# Patient Record
Sex: Female | Born: 1965 | Race: White | Hispanic: No | Marital: Single | Smoking: Former smoker
Health system: Southern US, Community
[De-identification: ages and names within clinical notes are randomized; demographics above are authoritative.]

## PROBLEM LIST (undated history)

## (undated) DIAGNOSIS — R42 Dizziness and giddiness: Secondary | ICD-10-CM

## (undated) DIAGNOSIS — R519 Headache, unspecified: Secondary | ICD-10-CM

## (undated) DIAGNOSIS — F329 Major depressive disorder, single episode, unspecified: Secondary | ICD-10-CM

## (undated) DIAGNOSIS — M8580 Other specified disorders of bone density and structure, unspecified site: Secondary | ICD-10-CM

## (undated) DIAGNOSIS — E0590 Thyrotoxicosis, unspecified without thyrotoxic crisis or storm: Secondary | ICD-10-CM

## (undated) DIAGNOSIS — N189 Chronic kidney disease, unspecified: Secondary | ICD-10-CM

## (undated) DIAGNOSIS — Z1371 Encounter for nonprocreative screening for genetic disease carrier status: Secondary | ICD-10-CM

## (undated) DIAGNOSIS — R51 Headache: Secondary | ICD-10-CM

## (undated) DIAGNOSIS — N200 Calculus of kidney: Secondary | ICD-10-CM

## (undated) DIAGNOSIS — F32A Depression, unspecified: Secondary | ICD-10-CM

## (undated) DIAGNOSIS — K635 Polyp of colon: Secondary | ICD-10-CM

## (undated) HISTORY — DX: Encounter for nonprocreative screening for genetic disease carrier status: Z13.71

## (undated) HISTORY — DX: Thyrotoxicosis, unspecified without thyrotoxic crisis or storm: E05.90

## (undated) HISTORY — DX: Dizziness and giddiness: R42

## (undated) HISTORY — DX: Major depressive disorder, single episode, unspecified: F32.9

## (undated) HISTORY — PX: UPPER GI ENDOSCOPY: SHX6162

## (undated) HISTORY — DX: Polyp of colon: K63.5

## (undated) HISTORY — PX: TUBAL LIGATION: SHX77

## (undated) HISTORY — PX: DILATION AND CURETTAGE OF UTERUS: SHX78

## (undated) HISTORY — DX: Calculus of kidney: N20.0

## (undated) HISTORY — PX: TONSILLECTOMY: SUR1361

## (undated) HISTORY — PX: LUMBAR DISC SURGERY: SHX700

## (undated) HISTORY — DX: Depression, unspecified: F32.A

## (undated) HISTORY — DX: Other specified disorders of bone density and structure, unspecified site: M85.80

---

## 1998-08-04 ENCOUNTER — Other Ambulatory Visit: Admission: RE | Admit: 1998-08-04 | Discharge: 1998-08-04 | Payer: Self-pay | Admitting: Gynecology

## 1999-08-10 ENCOUNTER — Other Ambulatory Visit: Admission: RE | Admit: 1999-08-10 | Discharge: 1999-08-10 | Payer: Self-pay | Admitting: Gynecology

## 2001-09-03 ENCOUNTER — Other Ambulatory Visit: Admission: RE | Admit: 2001-09-03 | Discharge: 2001-09-03 | Payer: Self-pay | Admitting: Gynecology

## 2002-09-08 ENCOUNTER — Other Ambulatory Visit: Admission: RE | Admit: 2002-09-08 | Discharge: 2002-09-08 | Payer: Self-pay | Admitting: Gynecology

## 2003-07-20 ENCOUNTER — Ambulatory Visit (HOSPITAL_BASED_OUTPATIENT_CLINIC_OR_DEPARTMENT_OTHER): Admission: RE | Admit: 2003-07-20 | Discharge: 2003-07-20 | Payer: Self-pay | Admitting: Gynecology

## 2003-11-04 ENCOUNTER — Other Ambulatory Visit: Admission: RE | Admit: 2003-11-04 | Discharge: 2003-11-04 | Payer: Self-pay | Admitting: Gynecology

## 2004-11-15 ENCOUNTER — Other Ambulatory Visit: Admission: RE | Admit: 2004-11-15 | Discharge: 2004-11-15 | Payer: Self-pay | Admitting: Gynecology

## 2005-01-20 ENCOUNTER — Ambulatory Visit (HOSPITAL_COMMUNITY): Admission: RE | Admit: 2005-01-20 | Discharge: 2005-01-20 | Payer: Self-pay | Admitting: Neurosurgery

## 2005-02-14 ENCOUNTER — Encounter (HOSPITAL_COMMUNITY): Admission: RE | Admit: 2005-02-14 | Discharge: 2005-03-16 | Payer: Self-pay | Admitting: Neurosurgery

## 2005-12-12 ENCOUNTER — Other Ambulatory Visit: Admission: RE | Admit: 2005-12-12 | Discharge: 2005-12-12 | Payer: Self-pay | Admitting: Gynecology

## 2006-12-28 ENCOUNTER — Other Ambulatory Visit: Admission: RE | Admit: 2006-12-28 | Discharge: 2006-12-28 | Payer: Self-pay | Admitting: Gynecology

## 2007-04-12 ENCOUNTER — Ambulatory Visit (HOSPITAL_COMMUNITY): Admission: RE | Admit: 2007-04-12 | Discharge: 2007-04-12 | Payer: Self-pay | Admitting: Gynecology

## 2007-04-15 ENCOUNTER — Ambulatory Visit (HOSPITAL_COMMUNITY): Admission: RE | Admit: 2007-04-15 | Discharge: 2007-04-16 | Payer: Self-pay | Admitting: Gynecology

## 2007-04-15 ENCOUNTER — Encounter: Payer: Self-pay | Admitting: Gynecology

## 2007-04-15 HISTORY — PX: ABDOMINAL HYSTERECTOMY: SHX81

## 2008-04-29 ENCOUNTER — Other Ambulatory Visit: Admission: RE | Admit: 2008-04-29 | Discharge: 2008-04-29 | Payer: Self-pay | Admitting: Gynecology

## 2008-05-19 ENCOUNTER — Ambulatory Visit: Payer: Self-pay | Admitting: Gynecology

## 2009-04-30 ENCOUNTER — Other Ambulatory Visit: Admission: RE | Admit: 2009-04-30 | Discharge: 2009-04-30 | Payer: Self-pay | Admitting: Gynecology

## 2009-04-30 ENCOUNTER — Encounter: Payer: Self-pay | Admitting: Gynecology

## 2009-04-30 ENCOUNTER — Ambulatory Visit: Payer: Self-pay | Admitting: Gynecology

## 2009-08-10 ENCOUNTER — Ambulatory Visit: Payer: Self-pay | Admitting: Gynecology

## 2010-05-06 ENCOUNTER — Ambulatory Visit: Payer: Self-pay | Admitting: Gynecology

## 2010-05-06 ENCOUNTER — Other Ambulatory Visit: Admission: RE | Admit: 2010-05-06 | Discharge: 2010-05-06 | Payer: Self-pay | Admitting: Gynecology

## 2010-05-13 ENCOUNTER — Ambulatory Visit: Payer: Self-pay | Admitting: Gynecology

## 2010-05-31 ENCOUNTER — Ambulatory Visit: Payer: Self-pay | Admitting: Gynecology

## 2010-06-04 DIAGNOSIS — Z1371 Encounter for nonprocreative screening for genetic disease carrier status: Secondary | ICD-10-CM

## 2010-06-04 HISTORY — DX: Encounter for nonprocreative screening for genetic disease carrier status: Z13.71

## 2010-07-14 ENCOUNTER — Encounter (HOSPITAL_COMMUNITY)
Admission: RE | Admit: 2010-07-14 | Discharge: 2010-08-13 | Payer: Self-pay | Source: Home / Self Care | Attending: Endocrinology | Admitting: Endocrinology

## 2010-07-23 ENCOUNTER — Emergency Department (HOSPITAL_COMMUNITY): Admission: EM | Admit: 2010-07-23 | Discharge: 2010-07-23 | Payer: Self-pay | Admitting: Emergency Medicine

## 2010-11-15 LAB — GLUCOSE, CAPILLARY: Glucose-Capillary: 74 mg/dL (ref 70–99)

## 2011-01-17 NOTE — Op Note (Signed)
NAME:  Dawn Mcmahon, Dawn Mcmahon                  ACCOUNT NO.:  [id number]   MEDICAL RECORD NO.:  [id number]          PATIENT TYPE:  OIB   LOCATION:  9303                          FACILITY:  [facility]   PHYSICIAN:  Juan H. Lily Peer, M.D.DATE OF BIRTH:  22-Apr-1966   DATE OF PROCEDURE:  04/15/2007  DATE OF DISCHARGE:                               OPERATIVE REPORT   SURGEON:  Juan H. Lily Peer, M.D.   FIRST ASSISTANT:  Edyth Gunnels, M.D.   INDICATIONS FOR OPERATION:  45 year old gravida 2, para 1, AB 1 with  dysmenorrhea, menorrhagia, chronic right lower quadrant pain.   PREOPERATIVE DIAGNOSIS:  1. Chronic right lower quadrant pelvic pain.  2. Dysmenorrhea.  3. Menorrhagia.   POSTOPERATIVE DIAGNOSIS:  1. Chronic right lower quadrant pelvic pain.  2. Dysmenorrhea.  3. Menorrhagia.   ANESTHESIA:  General endotracheal anesthesia.   PROCEDURE PERFORMED:  Laparoscopic-assisted vaginal hysterectomy with  right salpingo-oophorectomy.   FINDINGS:  Questionable endometriotic seeding was noted on the  peritoneal surface near the bladder and the right ovary. Evidence of  previous tubal ligation Hulka clips were noted, proximal fallopian  tubes. Contralateral ovary appeared to be normal.  No abnormalities  noted in the cul-de-sac or the appendix.   DESCRIPTION OF OPERATION:  After the patient adequately counseled she  was taken to the operating room where she underwent successful general  endotracheal anesthesia.  She had PSA stockings for DVT prophylaxis and  received a gram of cefoxitin IV for prophylaxis as well.  She was placed  in low lithotomy position and the abdomen, vagina and perineum were  prepped and draped in usual sterile fashion.  A Foley catheter had been  inserted in effort to monitor urinary output during the operation and  postoperatively.  A small stab incision was made under the umbilicus and  a 10 mm OptiVu trocar was inserted into the peritoneal cavity under  laparoscopic  guidance.  Once the peritoneum was entered the trocar was  removed.  The sleeve was left in place and the laparoscope was inserted.  Two additional port sites were made with 5 mm trocar approximately five  fingerbreadths from the midline in lower abdomen and after systematic  inspection of the pelvic and peritoneal cavity as described above the  procedure was started.  The right infundibulopelvic ligament was  identified and with the use of the harmonic scalpel the  infundibulopelvic ligament was coapted and transected as was the  remainder of the broad and cardinal ligament and round ligament  respectively which was carried down to the level of the right vaginal  fornix. On the contralateral side the left tube and ovary was left in  place.  The left uterosacral ligament was grasped with the harmonic  scalpel and the ligaments were coapted and transected as was the  proximal fallopian tube and round ligament and the remaining broad and  cardinal ligaments to the level of the left vaginal fornix with the  cutting edge of the harmonic scalpel. The bladder flap was established  and the second portion of the operation took effect in the vaginal  region to complete the hysterectomy.  The legs were then placed in high  lithotomy position and short weighted billed speculum was placed in the  posterior vaginal vault.  Both anterior posterior cervical lips were  grasped with a Heaney fashion with Jacobs clamps and put under tension.  The cervicovaginal fold was infiltrated with 1% lidocaine with 1:100,000  epinephrine for total 10 mL. The cervicovaginal fold was incised with a  scalpel in circumferential manner.  The posterior colpotomy was then  established and once entering the cul-de-sac the long weighted speculum  was placed into the posterior cul-de-sac.  The right and left  uterosacral ligaments were clamped, cut and suture ligated with 0 Vicryl  suture as was the remaining of the broad and  cardinal ligaments to level  both pedicles.  The anterior colpotomy was accomplished and the  remaining pedicles were clamped, cut and suture ligated with 0 Vicryl  suture. The uterus, cervix and right tube and ovary were passed off the  operative field.  The posterior vaginal mucosa and peritoneum were  reapproximated in a running baseball stitch from the 3 to 9 o'clock  position.  The area was copiously irrigated with normal saline solution  and closure was started.  The short weighted speculum was then placed  and the vaginal cuff was closed with interrupted sutures of 0 Vicryl  sutures. The area was once again copiously irrigated with normal saline  solution and a third portion of the operation we looked once again to  the peritoneal cavity after new insufflation was started. After  systematic inspection the right infundibulopelvic ligament appeared to  be dry as was the left. Small areas near the cuff that had been oozing  were contained with a bipolar cautery.  The pelvic cavity was copiously  irrigated with normal saline solution. For additional hemostasis  Surgicel was placed at the vaginal cuff. The pneumoperitoneum was  removed.  The instruments were removed under laparoscopic guidance.  The  subumbilical fascia was closed with a figure-of-eight of 0 Vicryl suture  and subcuticular stitch of 3-0 plain catgut suture.  The skin on this  the subumbilical as well as two 5 mm ports were approximated with  Dermabond glue and for postoperative analgesia 0.25% percent Marcaine  was infiltrated all three port sites for total 10 mL. The patient was  then extubated, transferred to recovery with stable vital signs.  Blood  loss was 175 mL, IV fluid 1200 mL of lactated Ringer's.  Urine output  was 100 mL.      Juan H. Lily Peer, M.D.  Electronically Signed     JHF/MEDQ  D:  04/15/2007  T:  04/15/2007  Job:  [id number]

## 2011-01-17 NOTE — Discharge Summary (Signed)
NAME:  Dawn Mcmahon, Dawn Mcmahon                  ACCOUNT NO.:  [id number]   MEDICAL RECORD NO.:  [id number]          PATIENT TYPE:  OIB   LOCATION:  9303                          FACILITY:  [facility]   PHYSICIAN:  Juan H. Lily Peer, M.D.DATE OF BIRTH:  07/09/1966   DATE OF ADMISSION:  04/15/2007  DATE OF DISCHARGE:  04/16/2007                               DISCHARGE SUMMARY   HISTORY OF PRESENT ILLNESS:  The patient is a 45 year old gravida 2,  para 1, AB 1 who was taken to the operating room on the morning of  August 11 where she underwent a laparoscopic-assisted vaginal  hysterectomy with right salpingo-oophorectomy secondary dysmenorrhea,  menorrhagia, chronic lower quadrant pain.  The patient postoperatively  did well and had adequate urine output on her first postoperative day  #1.  Her diet had been advanced from clear to a regular diet, had been  up ambulating and had not passed flatus yet but had good bowel sounds.  Incision sites were intact.  She was to have lunch at noon and be  discharged home later that afternoon. Her postoperative hemoglobin was  9.7, platelet count 146,000.   FINAL DIAGNOSIS:  1. Dysmenorrhea.  2. Menorrhagia.  3. Chronic right lower quadrant pain.  4. Possible endometriosis.  Pathology pending.   PROCEDURE PERFORMED:  Laparoscopic-assisted vaginal hysterectomy, right  salpingo-oophorectomy.   DISPOSITION:  Follow-up; the patient discharged home after first  postoperative day. She was up ambulating, tolerating regular diet well  and is to follow-up in the office in 2 weeks for postop visit.   DISCHARGE MEDICATIONS:  1. She had been given a prescription of Lortab 7.5/500 take one p.o.      q.4-6 hours p.r.n. pain.  2. Reglan 10 mg one p.o. q.4-6 hours p.r.n. nausea.  3. The patient was also to take one iron tablet daily and will check a      CBC at postop visit.      Juan H. Lily Peer, M.D.  Electronically Signed     JHF/MEDQ  D:  04/16/2007  T:   04/16/2007  Job:  [id number]

## 2011-01-17 NOTE — H&P (Signed)
NAME:  Dawn Mcmahon, Dawn Mcmahon                  ACCOUNT NO.:  [id number]   MEDICAL RECORD NO.:  [id number]           PATIENT TYPE:   LOCATION:                                [facility]ACILITY[facility]  [facility]   PHYSICIAN:  Juan H. Lily Peer, M.D.DATE OF BIRTH:  [birth year]   DATE OF ADMISSION:  04/12/2007  DATE OF DISCHARGE:                              HISTORY & PHYSICAL   The patient is scheduled for surgery at [facility] on Monday,  August 11th at 7:30 a.m.   CHIEF COMPLAINT:  Dysmenorrhea, menorrhagia, chronic right lower  quadrant pain.   HISTORY:  The patient is a 45 year old, gravida 2, para 1, AB1, who was  seen in the office on April 09, 2007 for a preoperative consultation.  The patient has had a history of a prior cesarean section and a  laparoscopic tubal ligation in the past.  She had been evaluated on  numerous occasions in the office secondary to dysfunctional uterine  bleeding, chronic right lower quadrant pain, and dyspareunia.  Her  workup consisted of an ultrasound recently which demonstrated normal  uterus and ovaries.  Sonohysterogram with no intracavity defect.  Recent  endometrial biopsy was benign.  Her recent Pap smear was also normal as  well.  She is scheduled for laparoscopic-assisted vaginal hysterectomy  with right salpingo-oophorectomy.  The patient had voiced irregardless  of the findings, she would like to proceed with having her ovary  removed.   PAST MEDICAL HISTORY:   ALLERGIES:  OMNICEF.   MEDICATIONS:  She takes calcium with vitamin D for osteoporosis  prevention.   The only medical problem she has had at times in the past had been some  slight vertigo.   SURGICAL PROCEDURE:  1. One D&C.  2. One C-section.  3. One laparoscopic tubal ligation.  4. Diskectomy at L4-L5.   FAMILY HISTORY:  Father with a history of colon cancer in the 16s.  And,  mother with history of cancer at the age of 62.   PHYSICAL EXAMINATION:  VITAL SIGNS:  The patient weighs 133 pounds.   She  is 5 feet 3 inches tall.  Blood pressure 132/86.  HEENT:  Unremarkable.  NECK:  Supple.  Trachea is midline.  No carotid bruits.  No thyromegaly.  LUNGS:  Clear to auscultation without rhonchi or wheeze.  HEART:  Regular rate and rhythm.  No murmurs or gallops.  BREAST:  Exam not done.  ABDOMEN:  Soft, nontender without rebound or guarding.  PELVIC:  Bartholin, urethral, and Skene glands within normal limits.  Vagina and cervix with no lesions or discharge.  Uterus upper limits of  normal.  No palpable masses or tenderness.  RECTAL:  Deferred.   ASSESSMENT:  A 45 year old gravida 2, para 1, AB1 with a history of  dysfunctional uterine bleeding, chronic right lower quadrant pain and  dyspareunia.   The patient is scheduled for laparoscopic-assisted vaginal hysterectomy  on Monday, April 15, 2007.  The risks, benefits, and pros and cons of  the operation were discussed including infection, bleeding, trauma to  internal organs requiring emergency laparotomy in  the event of  uncontrolled hemorrhage and patient were to need blood or blood  products.  She is fully aware of the potential risks of anaphylactic  reaction, hepatitis, and AIDS.  She will wear PSA stockings for DVT  prophylaxis and will receive antibiotics for prophylaxis as well.  The  patient had previously been provided with literature/information from  the [employer] of OB/GYN outlining laparoscopic hysterectomy and  all questions were entertained and the patient would like to proceed  with the above recommendation.   PLAN:  As per assessment above.      Juan H. Lily Peer, M.D.  Electronically Signed     JHF/MEDQ  D:  04/12/2007  T:  04/12/2007  Job:  [id number]

## 2011-01-20 NOTE — Op Note (Signed)
NAME:  Dawn Mcmahon, Dawn Mcmahon                            ACCOUNT NO.:  [id number]   MEDICAL RECORD NO.:  [id number]                   PATIENT TYPE:  AMB   LOCATION:  [facility]                                 FACILITY:  [facility]   PHYSICIAN:  Juan H. Lily Peer, M.D.             DATE OF BIRTH:  1966-03-26   DATE OF PROCEDURE:  07/20/2003  DATE OF DISCHARGE:                                 OPERATIVE REPORT   INDICATIONS FOR PROCEDURE:  A 45 year old patient requesting elective  permanent sterilization. The patient previously counseled as to the risks,  benefits, pros and cons of laparoscopic tubal sterilization procedure. The  patient fully aware this is a permanent sterilization. She has one 9-year-  old child and wishes not to have any more children.   PREOPERATIVE DIAGNOSIS:  Request for elective sterilization;   POSTOPERATIVE DIAGNOSIS:  Request for elective sterilization;   ANESTHESIA:  General endotracheal anesthesia.   SURGEON:  Juan H. Lily Peer, M.D.   PROCEDURE:  Laparoscopic tubal sterilization Hulka clip technique bilateral.   FINDINGS:  Normal uterus, tubes and ovaries, normal appearing appendix,  smooth appearance of liver surface and normal appearing external  gallbladder.   DESCRIPTION OF PROCEDURE:  After the patient was adequately counseled, she  was taken to the operating room where she underwent a successful general  endotracheal anesthesia. She was placed in the low lithotomy position and  the abdomen, vagina and perineum were prepped and draped in the usual  sterile fashion. A red rubber Roxan Hockey was inserted to evacuate the bladder  of its contents for approximately 75 mL. Examination under anesthesia  demonstrated an anteverted size uterus, no palpable masses or tenderness. A  Hulka tenaculum had been placed to facilitate the movement of the uterus at  the time of laparoscopic procedure. After the drapes were in place, a small  stab incision was made in the infraumbilical  region and a Veress needle was  introduced and opening intraabdominal pressure was 5 mmHg and approximately  2 liters of carbon dioxide were insufflated into the peritoneal cavity. The  Veress needle was removed and then a 10/11 mm trocar was inserted into the  peritoneal cavity, the trocar was removed, the sleeve was left in place and  a 10/11 mm operative scope was introduced into the pelvic cavity. A second  port was made with a 5 mm trocar 2 cm above the symphysis pubis on the  midline under laparoscopic guidance. A systematic inspection of the uterus,  tubes, and ovary demonstrated no evidence of adhesions or endometriosis. A  smooth appearing appendix, smooth appearing liver surface and gallbladder.  Attention was then placed in the proximal one-third portion of the right  fallopian tube. With a self retaining grasper, the distal portion of the  right fallopian tube was placed around the tension and the Hulka clip was  placed and the proximal 1/3 portion of the fallopian tube completely  occluding a small segment of the tube. A similar procedure was carried out  on the contralateral side, pictures were taken before and after the  sterilization procedure, a copy will be kept in the patient's record and a  second copy will be kept in the [facility] office. The carbon  dioxide was removed from the peritoneal cavity, the instruments were  removed. The 10 mm trocar site, the fascia was closed with a figure-of-eight  suture of 3-0 Vicryl suture and subcutaneous tissue was reapproximated with  a subcuticular stitch of 3-0 Vicryl suture. The 5 mm trocar site, the skin  was reapproximated with interrupted sutures of 4-0 plain catgut suture. The  Hulka tenaculum removed, the cervix was inspected and a small area where the  tenaculum was in place was oozing and silver nitrate was used to contain the  bleeding without any further problems.  The patient was the extubated, transferred to  the recovery room with stable  vital signs. She did receive 10 mL of 0.25%  Marcaine was infiltrated in  both incision sites for postoperative analgesia and she received 30 mg of  Toradol in route to the recovery room.                                               Juan H. Lily Peer, M.D.    JHF/MEDQ  D:  07/20/2003  T:  07/20/2003  Job:  [id number]

## 2011-01-20 NOTE — H&P (Signed)
NAME:  Dawn Mcmahon, Dawn Mcmahon                              ACCOUNT NO.:  [id number]   MEDICAL RECORD NO.:  [id number]                   PATIENT TYPE:   LOCATION:                                       FACILITY:   PHYSICIAN:  Juan H. Lily Peer, M.D.             DATE OF BIRTH:   DATE OF ADMISSION:  07/20/2003  DATE OF DISCHARGE:                                HISTORY & PHYSICAL   CHIEF COMPLAINT:  Request for elective permanent sterilization.   HISTORY:  The patient is a 45 year old, gravida 2, para 1, AB 1, who was  seen in January as part of her annual gynecological examination and then  once again for preoperative consultation on July 02, 2003.  The patient  had been requesting elective permanent sterilization.  She is a chronic  smoker.  She had been on Depo-Provera injection in the past and had been  recently using barrier contraception.  When she was seen in the office, she  was given literature information from the [employer] of OB/GYN  outlining laparoscopic tubal sterilization procedure.  She is fully aware  that this procedure is permanent and she will not be able to have more  children.  She has made a conscientious decision and would like to proceed  with this.   ALLERGIES:  She is allergic to OMNICEF.   MEDICATIONS:  She takes Allegra D p.r.n. and Valium 2 mg b.i.d. p.r.n.  She  had been on Depo-Provera in the past.  Her last injection was in May of  2003.  She is also taking calcium supplementation.   PAST MEDICAL HISTORY:  She suffers at times from vertigo.   PAST SURGICAL HISTORY:  Her past operations consist of one D&C and one  cesarean section.   FAMILY HISTORY:  Her mother has a history of colon cancer in her 37s and a  sister with breast cancer at the age of 67.  The patient's last mammogram  was in January of 2003.   PHYSICAL EXAMINATION:  WEIGHT:  The patient weighs 129 pounds.  HEIGHT:  5 feet 2-1/2 inches tall.  VITAL SIGNS:  Blood pressure 124/70.  HEENT:  Unremarkable.  NECK:  Supple.  Trachea midline.  No carotid bruits.  No thyromegaly.  LUNGS:  Clear to auscultation without rhonchi or wheezes.  HEART:  Regular rate and rhythm.  No murmurs or gallops.  BREASTS:  Exam at the time of her annual gynecological examination was  normal.  ABDOMEN:  Soft and nontender without rebound or guarding.  PELVIC:  Bartholin's, urethral, and Skene's glands within normal limits.  Vagina and cervix with no lesions or discharge.  Uterus anteverted and  normal in size, shape, and consistency.  Adnexa without mass or tenderness.  RECTAL:  Exam was deferred.   ASSESSMENT:  The patient is now a 45 year old, gravida 2, para 1, AB 1,  previous cesarean section, prior usage of  Depo-Provera for contraception and  most recently barrier contraception, requesting elective permanent  sterilization.  She has been counseled on numerous occasions and literature  information from the [employer] of OB/GYN had been provided outlining  the need for the laparoscopic tubal sterilization procedure.  Risks  discussed at the time of preoperative consultation consisted of the  following:  The risk from laparoscopic surgery to include trauma to internal  organs or difficulty gaining access to the abdominal cavity requiring a  laparotomy and competing the sterilization and repair of any damage or  trauma from the laparoscopic instruments.  This would require additional  hospitalization dates.  She will receive prophylactic antibiotic.  Due to  the fact that she is 45 years of age and a smoker, we will make sure that  she has pneumatic compression stockings as well to prevent risk for deep  venous thrombosis and subsequent pulmonary embolism.  The patient is fully  aware that the procedure is permanent and that she will not be able to have  any more children.  She feels comfortable with this decision and will follow  according.  In the remote possibility of blood  transfusion, the patient is  fully aware of the risks from blood transfusion from donor blood to include  anaphylactic reaction, hepatitis, and AIDS.  All of the above were discussed  in detail and all questions were answered.  Will follow accordingly.   PLAN:  The patient is scheduled for a laparoscopic tubal sterilization  procedure on Monday, July 20, 2003, at the [facility].                                               Juan H. Lily Peer, M.D.    JHF/MEDQ  D:  07/19/2003  T:  07/19/2003  Job:  [id number]

## 2011-01-20 NOTE — Op Note (Signed)
NAME:  Dawn Mcmahon, Dawn Mcmahon                  ACCOUNT NO.:  [id number]   MEDICAL RECORD NO.:  [id number]          PATIENT TYPE:  OIB   LOCATION:  2899                         FACILITY:  [facility]   PHYSICIAN:  Reinaldo Meeker, M.D. DATE OF BIRTH:  08-15-1966   DATE OF PROCEDURE:  01/20/2005  DATE OF DISCHARGE:                                 OPERATIVE REPORT   PREOPERATIVE DIAGNOSIS:  Herniated disk, L4-5, right.   POSTOPERATIVE DIAGNOSIS:  Herniated disk, L4-5, right.   PROCEDURE:  Right L4-5 intralaminar laminotomy with excision of herniated  disk with operating microscope.   SURGEON:  Reinaldo Meeker, M.D.   ASSISTANT:  Marikay Alar, M.D.   PROCEDURE IN DETAIL:  After being placed in the prone position, the  patient's back was prepped and draped in the usual sterile fashion. An  localizing x-ray was taken prior to incision to identify the appropriate  level. A midline incision made above the spinous process of L4 and L5. Using  the Bovie cutting current, the incision was carried down to spinous  processes. Subperiosteal dissection was then carried out along the right  side of the spinous processes and lamina. Self-retaining retractor was  placed for exposure. X-ray showed we approached the appropriate level. Using  the high-speed drill, we drilled one-third of the L4 lamina and median one-  third of the facet joint was removed. The drill was then used to remove the  superior one-third of the L5 lamina. Residual bone and ligamentum flavum  were removed in a piecemeal fashion. The microscope was draped and brought  in the field and used for the remainder of the case. Using microdissection  technique, the lateral aspect of the thecal sac and L5 nerve root were  identified. Further coagulation was carried out down to the floor of the  canal to identify the L4-5 disk, which was found to be tremendously  herniated beneath the nerve root. After coagulating on the annulus, the  annulus was incised  with a 15 blade. Using pituitary rongeurs and curettes,  thorough disk space cleanout was carried out, while at the same time great  care was taken to avoid injury to the neural elements, and this was  successfully done. At this point, inspection was carried out in all  directions for the evidence of residual compression and none could be  identified. Large amounts of irrigation were carried out and any bleeding  controlled with the help of coagulation and Gelfoam. It was then closed  using interrupted Vicryl in the muscle and fascia, subcutaneous,  subcuticular tissues and Dermabond on the skin. A sterile dressing was then  applied, and the patient was extubated and taken to recovery room in stable  condition.      ROK/MEDQ  D:  01/20/2005  T:  01/20/2005  Job:  [id number]

## 2011-05-04 ENCOUNTER — Encounter: Payer: Self-pay | Admitting: Anesthesiology

## 2011-05-04 DIAGNOSIS — R42 Dizziness and giddiness: Secondary | ICD-10-CM | POA: Insufficient documentation

## 2011-05-18 ENCOUNTER — Other Ambulatory Visit (HOSPITAL_COMMUNITY)
Admission: RE | Admit: 2011-05-18 | Discharge: 2011-05-18 | Disposition: A | Discharge status: Home / Self Care | Payer: BC Managed Care – PPO | Source: Ambulatory Visit | Attending: Gynecology | Admitting: Gynecology

## 2011-05-18 ENCOUNTER — Encounter: Payer: Self-pay | Admitting: Gynecology

## 2011-05-18 ENCOUNTER — Ambulatory Visit (INDEPENDENT_AMBULATORY_CARE_PROVIDER_SITE_OTHER): Payer: BC Managed Care – PPO | Admitting: Gynecology

## 2011-05-18 VITALS — BP 126/82 | Ht 61.75 in | Wt 142.0 lb

## 2011-05-18 DIAGNOSIS — N760 Acute vaginitis: Secondary | ICD-10-CM

## 2011-05-18 DIAGNOSIS — Z01419 Encounter for gynecological examination (general) (routine) without abnormal findings: Secondary | ICD-10-CM

## 2011-05-18 DIAGNOSIS — A499 Bacterial infection, unspecified: Secondary | ICD-10-CM

## 2011-05-18 DIAGNOSIS — E079 Disorder of thyroid, unspecified: Secondary | ICD-10-CM

## 2011-05-18 DIAGNOSIS — R42 Dizziness and giddiness: Secondary | ICD-10-CM

## 2011-05-18 DIAGNOSIS — B9689 Other specified bacterial agents as the cause of diseases classified elsewhere: Secondary | ICD-10-CM

## 2011-05-18 DIAGNOSIS — N951 Menopausal and female climacteric states: Secondary | ICD-10-CM

## 2011-05-18 DIAGNOSIS — Z13220 Encounter for screening for lipoid disorders: Secondary | ICD-10-CM

## 2011-05-18 DIAGNOSIS — Z131 Encounter for screening for diabetes mellitus: Secondary | ICD-10-CM

## 2011-05-18 DIAGNOSIS — Z800 Family history of malignant neoplasm of digestive organs: Secondary | ICD-10-CM

## 2011-05-18 DIAGNOSIS — R51 Headache: Secondary | ICD-10-CM

## 2011-05-18 DIAGNOSIS — B373 Candidiasis of vulva and vagina: Secondary | ICD-10-CM

## 2011-05-18 DIAGNOSIS — Z113 Encounter for screening for infections with a predominantly sexual mode of transmission: Secondary | ICD-10-CM

## 2011-05-18 LAB — T4: T4, Total: 7.1 ug/dL (ref 5.0–12.5)

## 2011-05-18 MED ORDER — FLUCONAZOLE 150 MG PO TABS: 150.0000 mg | ORAL_TABLET | Freq: Once | ORAL | Status: AC

## 2011-05-18 MED ORDER — CLINDAMYCIN PHOSPHATE 2 % VA CREA: 1.0000 | TOPICAL_CREAM | Freq: Every day | VAGINAL | Status: AC

## 2011-05-18 NOTE — Progress Notes (Signed)
Dawn Mcmahon [birth year] [id number]   History:    45 y.o.  for annual exam with several complaints today. Patient stated she's been having dizzy spells over the course of the past 2 months and had seen her primary physician who have treated her for suspected vertigo. Patient also has been followed by Dr. Lurene Shadow because of her history of thyrotoxicosis. She had been off the medication (PTU) but then returned back on it to see if that made any difference with her dizziness and headaches and she said that it made no difference. Her primary physician been referred her to the ENT who evaluated her and found no abnormality. She also had an MRI in 2011 in [place], [place] which was reported to be negative as well. Second issue discussed she wanted to have an STD screen she states that sometimes after intercourse she has alert discharge she is in a steady monogamous relationship. Patient has a history of an LAVH/BSO in the past. She has a mother who had colon cancer at the age of 41 patient as yet not had a colonoscopy. Her sister had breast cancer at the age of 49 as well as an aunt who also had breast cancer. The patient was tested for the BRCA1/BRCA2 mutation and none were detected in October of 2011. She has history of a right breast biopsy which was benign and is overdue for her mammogram this September. The other issue that she had was that time she is complaining of hot flashes and night sweats but very infrequent.  Past medical history,surgical history, family history and social history were all reviewed and documented in the EPIC chart.  ROS:  Was performed and pertinent positives and negatives are included in the history.  Exam: chaperone present BP 126/82  Ht 5' 1.75" (1.568 m)  Wt 142 lb (64.411 kg)  BMI 26.18 kg/m2  Body mass index is 26.18 kg/(m^2).  General appearance : Well developed well nourished female. No acute distress HEENT: Neck supple, trachea midline, no carotid bruits,  no thyroidmegaly Lungs: Clear to auscultation, no rhonchi or wheezes, or rib retractions  Heart: Regular rate and rhythm, no murmurs or gallops Breast:Examined in sitting and supine position were symmetrical in appearance, no palpable masses or tenderness,  no skin retraction, no nipple inversion, no nipple discharge, no skin discoloration, no axillary or supraclavicular lymphadenopathy Abdomen: no palpable masses or tenderness, no rebound or guarding Extremities: no edema or skin discoloration or tenderness  Pelvic:  Bartholin, Urethra, Skene Glands: Within normal limits             Vagina: No gross lesions, thick white discharge was noted. Cervix: Absent  Uterus  absent  Adnexa  Without masses or tenderness  Anus and perineum  normal   Rectovaginal  normal sphincter tone without palpated masses or tenderness             Hemoccult done pending at time of this dictation     Assessment/Plan:  45 y.o. female for annual exam with multiple issues that were discussed today. We'll run a full thyroid panel today because of her history of thyrotoxicosis and no longer been on medications to see this is a contributing factor to her headaches and dizziness. We'll also check a serum prolactin level as well although she denied any nipple discharge per se we'll be checking her CBC to rule out anemia we'll do a cholesterol screen and because of her vasomotor symptoms will check an [facility] in the event that she  may be perimenopausal. The vaginal wet prep demonstrated evidence of bacterial vaginosis and moniliasis for which will be treating her with Diflucan 150 mg by mouth and Cleocin vaginal cream each bedtime for 5 days. Results of the GC and Chlamydia culture pending at time of this dictation. To complete the STD screen and HIV, RPR, and hepatitis B surface antigen test will be drawn today as well. Patient was encouraged to schedule her mammogram and to continue her monthly self breast examination. She was reminded  also to schedule her colonoscopy. Fecal call blood testing pending at time of this dictation. If her thyroid function tests are normal were going to refer her to a neurologist for further evaluation. If her thyroid function or abnormal she will be referred back to Dr. Lurene Shadow who has been monitoring her in the past for thyrotoxicosis. She will call next week for the results and we'll give her guidance as to what the next step will be. She was normotensive today and has had visual exams recently. Pap smear was done today. All questions were answered and we'll follow accordingly.    Ok Edwards MD, 5:37 PM 05/18/2011

## 2011-05-19 LAB — HEPATITIS B SURFACE ANTIGEN: Hepatitis B Surface Ag: NEGATIVE

## 2011-05-19 LAB — RPR

## 2011-05-25 ENCOUNTER — Telehealth: Payer: Self-pay | Admitting: *Deleted

## 2011-05-25 DIAGNOSIS — E7800 Pure hypercholesterolemia, unspecified: Secondary | ICD-10-CM

## 2011-05-25 NOTE — Telephone Encounter (Signed)
Pt informed with the below note and order in computer for repeat lab.

## 2011-05-25 NOTE — Telephone Encounter (Signed)
Message copied by Aura Camps on Thu May 25, 2011  8:48 AM ------      Message from: Ok Edwards      Created: Wed May 24, 2011  5:53 PM       Victorino Dike, please inform patient that I just received her total cholesterol was elevated at 209 it should be less than 200 and I would like for her to have a fasting lipid profile here in the office.

## 2011-06-05 ENCOUNTER — Encounter: Payer: Self-pay | Admitting: Gynecology

## 2011-06-15 ENCOUNTER — Encounter: Payer: Self-pay | Admitting: Gynecology

## 2011-06-15 ENCOUNTER — Ambulatory Visit (INDEPENDENT_AMBULATORY_CARE_PROVIDER_SITE_OTHER): Payer: BC Managed Care – PPO | Admitting: Gynecology

## 2011-06-15 ENCOUNTER — Ambulatory Visit (INDEPENDENT_AMBULATORY_CARE_PROVIDER_SITE_OTHER): Payer: BC Managed Care – PPO | Admitting: *Deleted

## 2011-06-15 VITALS — BP 120/70 | Wt 144.0 lb

## 2011-06-15 DIAGNOSIS — R6882 Decreased libido: Secondary | ICD-10-CM

## 2011-06-15 DIAGNOSIS — N951 Menopausal and female climacteric states: Secondary | ICD-10-CM

## 2011-06-15 DIAGNOSIS — E789 Disorder of lipoprotein metabolism, unspecified: Secondary | ICD-10-CM

## 2011-06-15 DIAGNOSIS — E7800 Pure hypercholesterolemia, unspecified: Secondary | ICD-10-CM

## 2011-06-15 DIAGNOSIS — E8941 Symptomatic postprocedural ovarian failure: Secondary | ICD-10-CM

## 2011-06-15 NOTE — Progress Notes (Signed)
Patient 45-year-old who was seen in the office for her annual exam last week presented to the office for discussion on hormone replacement therapy due to her factor she's been having vasomotor symptoms consisting of night sweats some vaginal dryness some decreased libido irritability and mood swing. Patient with prior history of LAVH/RSO in 2008. Patient previously was cream for the BRCA one BRCA2 mutation which was negative. She had a right breast biopsy in 2011 in January. She is placental followup mammogram sometime in September and as the process of scheduling. She'll schedule colonoscopy for later in the month.  Recent labs her CBC was normal blood sugar was normal total cholesterol elevated 209 she's here in the office today for fasting lipid profile urinalysis was negative she requested an STD screening GC and Chlamydia culture or when necessary I HIV were all negative thyroid function tests were normal but her FSH was elevated slightly at 34.  We will for a detailed discussion of hormone replacement therapy the risks benefits and pros and cons as well as detailed discussion of the WHI. She would like to try the use alternatives we discussed oral versus transdermal application of estrogen. We discussed the Olestra and 0.06% to apply topically to one arm daily and ligature information was provided on this as well. She was given sample and she did buy over-the-counter estroven which has no synthetic estrogen and they were also try soy products along with regular exercise. If her symptoms worsen and she would like to proceed with hormone replacement therapy we'll initiate the very low dose and continue surveillance. She is instructed to follow up with her mammogram to continue monthly self breast examinations. She is also instructed to take her calcium vitamin D for osteoporosis prevention as well. 

## 2011-06-15 NOTE — Progress Notes (Signed)
Patient 45 year old who was seen in the office for her annual exam last week presented to the office for discussion on hormone replacement therapy due to her factor she's been having vasomotor symptoms consisting of night sweats some vaginal dryness some decreased libido irritability and mood swing. Patient with prior history of LAVH/RSO in 2008. Patient previously was cream for the BRCA one BRCA2 mutation which was negative. She had a right breast biopsy in 2011 in January. She is placental followup mammogram sometime in September and as the process of scheduling. She'll schedule colonoscopy for later in the month.  Recent labs her CBC was normal blood sugar was normal total cholesterol elevated 209 she's here in the office today for fasting lipid profile urinalysis was negative she requested an STD screening GC and Chlamydia culture or when necessary I HIV were all negative thyroid function tests were normal but her FSH was elevated slightly at 34.  We will for a detailed discussion of hormone replacement therapy the risks benefits and pros and cons as well as detailed discussion of the WHI. She would like to try the use alternatives we discussed oral versus transdermal application of estrogen. We discussed the Olestra and 0.06% to apply topically to one arm daily and ligature information was provided on this as well. She was given sample and she did buy over-the-counter estroven which has no synthetic estrogen and they were also try soy products along with regular exercise. If her symptoms worsen and she would like to proceed with hormone replacement therapy we'll initiate the very low dose and continue surveillance. She is instructed to follow up with her mammogram to continue monthly self breast examinations. She is also instructed to take her calcium vitamin D for osteoporosis prevention as well.

## 2011-06-19 LAB — CBC
HCT: 27.4 — ABNORMAL LOW
HCT: 37.5
Hemoglobin: 12.8
MCHC: 34.2
MCHC: 35.5
MCV: 89.1
MCV: 89.2
Platelets: 146 — ABNORMAL LOW
Platelets: 205
RDW: 12.3
WBC: 7.1

## 2011-06-19 LAB — URINALYSIS, ROUTINE W REFLEX MICROSCOPIC
Bilirubin Urine: NEGATIVE
Glucose, UA: NEGATIVE
Hgb urine dipstick: NEGATIVE
Protein, ur: NEGATIVE

## 2011-06-22 ENCOUNTER — Telehealth: Payer: Self-pay | Admitting: *Deleted

## 2011-06-22 NOTE — Telephone Encounter (Signed)
Lm for pt to call. Pt called c/o bv infection still there.

## 2011-06-29 ENCOUNTER — Encounter: Payer: Self-pay | Admitting: Gynecology

## 2011-07-04 ENCOUNTER — Encounter: Payer: Self-pay | Admitting: Gynecology

## 2011-07-04 NOTE — Telephone Encounter (Signed)
Pt called wanting recent lab results, results given to pt. 

## 2011-07-06 ENCOUNTER — Encounter: Payer: Self-pay | Admitting: Gynecology

## 2011-08-21 ENCOUNTER — Telehealth: Payer: Self-pay | Admitting: *Deleted

## 2011-08-21 ENCOUNTER — Other Ambulatory Visit: Payer: Self-pay | Admitting: Gynecology

## 2011-08-21 MED ORDER — ESTRADIOL 0.52 MG/0.87 GM (0.06%) TD GEL: TRANSDERMAL | Status: DC

## 2011-08-21 NOTE — Telephone Encounter (Signed)
rx sent to pharmacy

## 2011-08-21 NOTE — Telephone Encounter (Signed)
Patient will be prescribed as we discussed before in the office visit elestrin 0.06% transdermal to apply one pump to one arm only daily. We had discussed the risks benefits and pros and cons of hormone replacement therapy at her last visit and her mammogram is up-to-date.

## 2011-08-21 NOTE — Telephone Encounter (Signed)
Pt called stating that she was told to call to get RX for estrogen tropical gel if decided to use this for vasomotor symptoms? Pt was seen on 06/16/11. Please advise

## 2011-08-23 ENCOUNTER — Ambulatory Visit (INDEPENDENT_AMBULATORY_CARE_PROVIDER_SITE_OTHER): Payer: BC Managed Care – PPO | Admitting: Gynecology

## 2011-08-23 ENCOUNTER — Encounter: Payer: Self-pay | Admitting: Gynecology

## 2011-08-23 VITALS — BP 142/86

## 2011-08-23 DIAGNOSIS — N898 Other specified noninflammatory disorders of vagina: Secondary | ICD-10-CM

## 2011-08-23 DIAGNOSIS — B373 Candidiasis of vulva and vagina: Secondary | ICD-10-CM

## 2011-08-23 MED ORDER — FLUCONAZOLE 100 MG PO TABS: 100.0000 mg | ORAL_TABLET | ORAL | Status: AC

## 2011-08-23 MED ORDER — FLUCONAZOLE 100 MG PO TABS: 100.0000 mg | ORAL_TABLET | Freq: Once | ORAL | Status: DC

## 2011-08-23 NOTE — Patient Instructions (Signed)
Take one fluconazole mtablet weekly for three months. Remember to follow up with Dr. Vela Prose

## 2011-08-23 NOTE — Progress Notes (Signed)
Patient presented to the office today with a complaint of vaginal discharge. Patient and monogamous relationship. Patient seen few months ago for annual exam who was complaining of vasomotor symptoms an [facility] was obtained and demonstrated she was in the menopausal state. Patient with prior history of total, hysterectomy with right salpingo-oophorectomy. Patient has had issues of recurrent BV and yeast in the past. She also brought to my attention that she's been followed by Dr. Vela Prose for her migraine headaches and recently was complaining of on and off dizziness. She also has informed me that recently she had a colonoscopy in a benign polyp was identified.  Blood pressure repeat 140/80  Patient brought to my attention that last week and she had acute sharp left lower quadrant discomfort the side where she has her remaining ovary and this has occurred sporadically.  Exam: Pelvic: Bartholin urethra Skene glands within normal limits vagina: Atrophic changes vaginal cuff intact bimanual exam: No palpable masses or tenderness, rectal exam not done.  Wet prep demonstrated moniliasis because of her recurrence he she will be placed on Diflucan 100 mg one by mouth q. weekly for 3 months. She'll continue to use a probiotic tablet daily. She should refrain from any douching and cut down on sweets and dairy products.  It was recommended she followup with her neurologist because of her dizzy symptoms since he has been monitoring her and treating her for migraines.

## 2011-10-12 ENCOUNTER — Encounter: Payer: Self-pay | Admitting: *Deleted

## 2011-10-12 ENCOUNTER — Telehealth: Payer: Self-pay | Admitting: *Deleted

## 2011-10-12 NOTE — Progress Notes (Signed)
Patient ID: Dawn Mcmahon, female   DOB: [birth year], 46 y.o.   MRN: [id number] Pt called and left message stating JF was going to have referral for gen. Surg. Doctor? Left message for pt to call.

## 2011-10-12 NOTE — Telephone Encounter (Signed)
We can give her the telephone number to Dr. Ovidio Kin or Dr. Abbey Chatters or Dr. Daphine Deutscher for her to followup if she still continues to have symptoms with her hemorrhoids.

## 2011-10-12 NOTE — Telephone Encounter (Signed)
Pt called and said that at her last office visit on 08/23/11 she spoke with you about a referral to general surgeon doctor for hemorrhoids? I didn't see anything in office note about this. Please advise

## 2011-10-18 NOTE — Telephone Encounter (Signed)
Tired to call pt and tell the below, but her phone would not let me record a message. Will try again.

## 2011-10-23 NOTE — Telephone Encounter (Signed)
Spoke with pt regarding the below note. 

## 2011-12-25 ENCOUNTER — Telehealth: Payer: Self-pay | Admitting: *Deleted

## 2011-12-25 NOTE — Telephone Encounter (Signed)
Pt called stating her daughter father went to the [place] hospital and was recently told that her was positive for hepatis C. Pt would like to know if she could get tested for this?

## 2011-12-26 NOTE — Telephone Encounter (Signed)
Patient will need to come in to the office to talk/ exam and draw the appropriate Hepatitis Panel

## 2011-12-26 NOTE — Telephone Encounter (Signed)
Left the below on pt voicemail. 

## 2011-12-27 ENCOUNTER — Encounter: Payer: Self-pay | Admitting: Gynecology

## 2011-12-27 ENCOUNTER — Ambulatory Visit (INDEPENDENT_AMBULATORY_CARE_PROVIDER_SITE_OTHER): Payer: BC Managed Care – PPO | Admitting: Gynecology

## 2011-12-27 VITALS — BP 130/88

## 2011-12-27 DIAGNOSIS — Z205 Contact with and (suspected) exposure to viral hepatitis: Secondary | ICD-10-CM

## 2011-12-27 DIAGNOSIS — Z20828 Contact with and (suspected) exposure to other viral communicable diseases: Secondary | ICD-10-CM

## 2011-12-27 DIAGNOSIS — Z113 Encounter for screening for infections with a predominantly sexual mode of transmission: Secondary | ICD-10-CM

## 2011-12-27 DIAGNOSIS — N898 Other specified noninflammatory disorders of vagina: Secondary | ICD-10-CM

## 2011-12-27 NOTE — Progress Notes (Signed)
Patient is a 46 year old who presented to the office today with concerns that her previous partner of many years had been diagnosed with hepatitis C. Patient denies any symptoms. Patient does have tattoos but denies any use of illicit drugs. She does have a recent new partner over the past few months and had a slight vaginal discharge and wanted to have also a full STD screen today.  Exam: Bartholin urethra Skene glands: Within normal limits Vagina: No lesions or discharge Cervix: No lesions or discharge Uterus: Anteverted normal size shape and consistency Adnexa: No palpable masses or tenderness rectal exam: Not done  GC and Chlamydia culture along with wet prep was done. Wet prep was negative cultures pending.  Patient will pass by the lab and we'll obtain a full acute hepatitis panel along with HIV and RPR. We'll have the results of the other week and notify the patient. I have recommended that her daughter who has lived with this individual (her father) that she also be tested for hepatitis.

## 2011-12-27 NOTE — Patient Instructions (Signed)
Viral Hepatitis Hepatitis is an inflammation of the liver. It can be caused by many different things, including several different viruses. These viruses are named hepatitis A, B, C, D, and E. All these viruses can cause short-term (acute) hepatitis. The hepatitis B, C, and D viruses can also cause longstanding (chronic) hepatitis. Chronic hepatitis infection is prolonged and sometimes lifelong. Other viruses may also cause hepatitis but have not yet been discovered. SYMPTOMS Some people have no symptoms. Others may have:  Fatigue.   Abdominal pain.   Loss of appetite.   Nausea.   Vomiting.   Low-grade fever.   Yellowing of the skin (jaundice).  HEPATITIS A Disease Spread Primarily through food or water contaminated by feces from an infected person. People at Risk  Travelers to any area of the world with poor sanitation.   People living in areas where hepatitis A outbreaks are common.   People who live with or have close contact with an infected person.   During outbreaks:   Day-care children and employees.   Men who have sex with men.   Injection drug users.   People who eat raw or undercooked shellfish (oysters, clams, mussels).   People who live or work in institutions.  Prevention  Receive the hepatitis A vaccine.   Avoid tap water, ice cubes made from tap water, and uncooked or inadequately cooked foods when traveling to areas with poor sanitation.   Avoid eating raw or undercooked shellfish.   Practice good hygiene and sanitation.  Treatment Hepatitis A usually resolves on its own over several weeks. HEPATITIS B Disease Spread  Through contact with infected blood.   Through sex with an infected person.   From mother to child during childbirth.  People at Risk  People who have sex with an infected person.   Men who have sex with men.   Injection drug users.   Children of immigrants from disease-endemic areas.   Infants born to infected mothers.    People who live with an infected person and are exposed to that person's blood.   Healthcare workers exposed to blood.   Hemodialysis patients.   People who received a transfusion of blood or blood products before July 1992 or clotting factors made before 1987.  Prevention  Receive the hepatitis B vaccine.   Healthcare workers need to avoid injuries and wear appropriate protective equipment such as gloves, gowns, and face masks when performing invasive medical or nursing procedures.   After exposure to infectious blood, if you were not previously and successfully vaccinated, receive a gamma globulin shot (HBIG), hepatitis B vaccine, or both.  Treatment Acute hepatitis B usually resolves on its own. For chronic hepatitis B, drug treatment is available and advised for many but not all patients. All patients who have chronic hepatitis B infection should be carefully monitored by a caregiver over time.  HEPATITIS C Disease Spread  Through contact with infected blood.   Through sex with an infected person.   From mother to child during childbirth.  People at Risk  Injection drug users.   People who have sex with an infected person.   People who have multiple sex partners.   Healthcare workers exposed to blood.   Infants born to infected mothers.   Hemodialysis patients.   People who received a transfusion of blood or blood products before July 1992 or clotting factors made before 1987.   People born between [birth year] and 1965.  Prevention  There is no vaccine for hepatitis C.  The only way to prevent the disease is to reduce the risk of exposure to blood that is contaminated with the virus. This means avoiding behaviors like sharing drug needles or sharing personal items like toothbrushes, razors, and nail clippers with an infected person.   Healthcare workers need to avoid injuries and wear appropriate protective equipment such as gloves, gowns, and face masks when performing  invasive medical or nursing procedures.  Treatment For acute hepatitis C, treatment may be recommended if it does not resolve within 2 to 3 months. For chronic hepatitis C, drug treatment is available and advised for many but not all patients. All patients who have chronic hepatitis C infection should be carefully monitored by a caregiver over time. HEPATITIS D Disease Spread Through contact with infected blood. This disease happens only in people who are already infected with hepatitis B or who become infected with hepatitis B and hepatitis D at the same time. People at Risk Anyone infected with hepatitis B. Injection drug users who have hepatitis B have the highest risk. People who have hepatitis B are also at risk if they have sex with a person infected with hepatitis D or if they live with an infected person. Also at risk are people who received a transfusion of blood or blood products before July 1992 or clotting factors made before 1987. Prevention  Receive the hepatitis B vaccine if you are not already infected.   Avoid exposure to infected blood.   Avoid exposure to contaminated needles.   Avoid exposure to an infected person's personal items (toothbrush, razor, nail clippers).  Treatment The combination of hepatitis D and hepatitis B infection usually causes very serious and progressive liver disease. Because of this, drug treatment is almost always recommended. Treatment regimens are the same as those recommended for the hepatitis B infection. HEPATITIS E Disease Spread Through food or water contaminated by feces from an infected person. This disease is common in [place], [place], the [place], and [place].  People at Risk  Travelers to areas of the world where hepatitis E infection is common.   People living in areas where hepatitis E outbreaks are common.   People who live with an infected person.  Prevention A vaccine for hepatitis E is not yet available. Currently,  the only way to prevent the disease is to reduce the risk of exposure to the virus. This means avoiding tap water, ice cubes made from tap water, and uncooked or inadequately cooked foods when traveling to hepatitis E endemic areas with poor sanitation.  Treatment Hepatitis E usually resolves on its own over several weeks to months. OTHER CAUSES OF VIRAL HEPATITIS Some cases of viral hepatitis cannot be attributed to the hepatitis A, B, C, D, or E viruses. This is called non A-E hepatitis. Scientists continue to study the causes of non A-E hepatitis. Document Released: 10/13/2004 Document Revised: 08/10/2011 Document Reviewed: 12/22/2010 [facility] Patient Information 2012 [place], [place].

## 2011-12-28 LAB — HEPATITIS PANEL, ACUTE
HCV Ab: NEGATIVE
Hep A IgM: NEGATIVE
Hep B C IgM: NEGATIVE
Hepatitis B Surface Ag: NEGATIVE

## 2011-12-28 LAB — GC/CHLAMYDIA PROBE AMP, GENITAL: GC Probe Amp, Genital: NEGATIVE

## 2012-03-17 ENCOUNTER — Encounter (HOSPITAL_COMMUNITY): Payer: Self-pay

## 2012-03-17 ENCOUNTER — Observation Stay (HOSPITAL_COMMUNITY)
Admission: EM | Admit: 2012-03-17 | Discharge: 2012-03-19 | Disposition: A | Discharge status: Home / Self Care | Payer: BC Managed Care – PPO | Attending: Urology | Admitting: Urology

## 2012-03-17 ENCOUNTER — Emergency Department (HOSPITAL_COMMUNITY): Payer: BC Managed Care – PPO

## 2012-03-17 DIAGNOSIS — R112 Nausea with vomiting, unspecified: Secondary | ICD-10-CM

## 2012-03-17 DIAGNOSIS — R42 Dizziness and giddiness: Secondary | ICD-10-CM | POA: Insufficient documentation

## 2012-03-17 DIAGNOSIS — N201 Calculus of ureter: Principal | ICD-10-CM | POA: Insufficient documentation

## 2012-03-17 DIAGNOSIS — N390 Urinary tract infection, site not specified: Secondary | ICD-10-CM | POA: Insufficient documentation

## 2012-03-17 DIAGNOSIS — N132 Hydronephrosis with renal and ureteral calculous obstruction: Secondary | ICD-10-CM

## 2012-03-17 DIAGNOSIS — N1330 Unspecified hydronephrosis: Secondary | ICD-10-CM | POA: Insufficient documentation

## 2012-03-17 DIAGNOSIS — R509 Fever, unspecified: Secondary | ICD-10-CM

## 2012-03-17 LAB — CBC WITH DIFFERENTIAL/PLATELET
Basophils Absolute: 0 10*3/uL (ref 0.0–0.1)
Basophils Relative: 0 % (ref 0–1)
Eosinophils Absolute: 0 10*3/uL (ref 0.0–0.7)
Eosinophils Relative: 0 % (ref 0–5)
HCT: 36.8 % (ref 36.0–46.0)
Hemoglobin: 12.7 g/dL (ref 12.0–15.0)
Lymphocytes Relative: 3 % — ABNORMAL LOW (ref 12–46)
Lymphs Abs: 0.3 10*3/uL — ABNORMAL LOW (ref 0.7–4.0)
MCH: 31.1 pg (ref 26.0–34.0)
MCHC: 34.5 g/dL (ref 30.0–36.0)
MCV: 90.2 fL (ref 78.0–100.0)
Monocytes Absolute: 0.2 10*3/uL (ref 0.1–1.0)
Monocytes Relative: 2 % — ABNORMAL LOW (ref 3–12)
Neutro Abs: 10.7 10*3/uL — ABNORMAL HIGH (ref 1.7–7.7)
Neutrophils Relative %: 95 % — ABNORMAL HIGH (ref 43–77)
Platelets: 165 10*3/uL (ref 150–400)
RBC: 4.08 MIL/uL (ref 3.87–5.11)
RDW: 11.9 % (ref 11.5–15.5)
WBC: 11.2 10*3/uL — ABNORMAL HIGH (ref 4.0–10.5)

## 2012-03-17 LAB — COMPREHENSIVE METABOLIC PANEL
ALT: 21 U/L (ref 0–35)
AST: 38 U/L — ABNORMAL HIGH (ref 0–37)
Albumin: 3.6 g/dL (ref 3.5–5.2)
Alkaline Phosphatase: 55 U/L (ref 39–117)
BUN: 15 mg/dL (ref 6–23)
CO2: 24 mEq/L (ref 19–32)
Calcium: 9.4 mg/dL (ref 8.4–10.5)
Chloride: 101 mEq/L (ref 96–112)
Creatinine, Ser: 1.04 mg/dL (ref 0.50–1.10)
GFR calc Af Amer: 73 mL/min — ABNORMAL LOW (ref >90)
GFR calc non Af Amer: 63 mL/min — ABNORMAL LOW (ref >90)
Glucose, Bld: 120 mg/dL — ABNORMAL HIGH (ref 70–99)
Potassium: 3.6 mEq/L (ref 3.5–5.1)
Sodium: 138 mEq/L (ref 135–145)
Total Bilirubin: 0.5 mg/dL (ref 0.3–1.2)
Total Protein: 7.1 g/dL (ref 6.0–8.3)

## 2012-03-17 LAB — URINALYSIS, ROUTINE W REFLEX MICROSCOPIC
Bilirubin Urine: NEGATIVE
Glucose, UA: NEGATIVE mg/dL
Ketones, ur: 15 mg/dL — AB
Nitrite: NEGATIVE
Protein, ur: NEGATIVE mg/dL
Specific Gravity, Urine: 1.015 (ref 1.005–1.030)
Urobilinogen, UA: 0.2 mg/dL (ref 0.0–1.0)
pH: 5.5 (ref 5.0–8.0)

## 2012-03-17 LAB — URINE MICROSCOPIC-ADD ON

## 2012-03-17 MED ORDER — GENTAMICIN SULFATE 40 MG/ML IJ SOLN
450.0000 mg | INTRAVENOUS | Status: DC
  Administered 2012-03-17 – 2012-03-19 (×2): 450 mg via INTRAVENOUS
  Filled 2012-03-17 (×3): qty 11.25

## 2012-03-17 MED ORDER — SODIUM CHLORIDE 0.9 % IV BOLUS (SEPSIS)
1000.0000 mL | Freq: Once | INTRAVENOUS | Status: AC
  Administered 2012-03-17: 1000 mL via INTRAVENOUS

## 2012-03-17 MED ORDER — DEXTROSE 5 % IV SOLN
1.0000 g | Freq: Once | INTRAVENOUS | Status: AC
  Administered 2012-03-17: 1 g via INTRAVENOUS
  Filled 2012-03-17: qty 10

## 2012-03-17 MED ORDER — SODIUM CHLORIDE 0.9 % IV SOLN
INTRAVENOUS | Status: DC
  Administered 2012-03-17 – 2012-03-19 (×4): via INTRAVENOUS

## 2012-03-17 MED ORDER — ONDANSETRON HCL 4 MG/2ML IJ SOLN
4.0000 mg | INTRAMUSCULAR | Status: DC | PRN
  Administered 2012-03-18 – 2012-03-19 (×5): 4 mg via INTRAVENOUS
  Filled 2012-03-17 (×5): qty 2

## 2012-03-17 MED ORDER — ONDANSETRON HCL 4 MG/2ML IJ SOLN
4.0000 mg | Freq: Once | INTRAMUSCULAR | Status: AC
  Administered 2012-03-17: 4 mg via INTRAVENOUS
  Filled 2012-03-17: qty 2

## 2012-03-17 MED ORDER — ACETAMINOPHEN 325 MG PO TABS
650.0000 mg | ORAL_TABLET | Freq: Once | ORAL | Status: AC
  Administered 2012-03-17: 650 mg via ORAL
  Filled 2012-03-17: qty 2

## 2012-03-17 MED ORDER — HYDROMORPHONE HCL PF 1 MG/ML IJ SOLN
1.0000 mg | Freq: Once | INTRAMUSCULAR | Status: AC
  Administered 2012-03-17: 1 mg via INTRAVENOUS
  Filled 2012-03-17: qty 1

## 2012-03-17 MED ORDER — MORPHINE SULFATE 2 MG/ML IJ SOLN
2.0000 mg | INTRAMUSCULAR | Status: DC | PRN
  Administered 2012-03-18 (×2): 4 mg via INTRAVENOUS
  Administered 2012-03-18 – 2012-03-19 (×6): 2 mg via INTRAVENOUS
  Filled 2012-03-17: qty 1
  Filled 2012-03-17: qty 2
  Filled 2012-03-17 (×5): qty 1
  Filled 2012-03-17: qty 2
  Filled 2012-03-17: qty 1

## 2012-03-17 NOTE — ED Notes (Signed)
I gave the patient a cup of ice and a ginger-ale. 

## 2012-03-17 NOTE — ED Notes (Signed)
Pt informed of diet, nothing by mouth with verbal understanding.

## 2012-03-17 NOTE — ED Notes (Signed)
Attempted to call report, RN Stanton Kidney unavailable at this time and to call back for pt report prior to transfer.

## 2012-03-17 NOTE — ED Notes (Signed)
MD Mcdiarmid changed pt diet to NPO after midnight.

## 2012-03-17 NOTE — ED Notes (Signed)
Pt reports decrease pain to 6/10, crackers given to pt. Pt has tolerated ginger ale with no reported or observed n/v. Pt is awaiting transfer to [facility] facility via [facility].

## 2012-03-17 NOTE — Progress Notes (Signed)
ANTIBIOTIC CONSULT NOTE - INITIAL  Pharmacy Consult for Gentamicin Indication:  Empiric for UTI  Allergies  Allergen Reactions  . Omnicef (Cefdinir)     disorientated    Patient Measurements: Height: 5' 2.5" (158.8 cm) Weight: 143 lb (64.864 kg) IBW/kg (Calculated) : 51.25   Vital Signs: Temp: 98.1 F (36.7 C) (07/14 2147) Temp src: Oral (07/14 2147) BP: 111/61 mmHg (07/14 2147) Pulse Rate: 102  (07/14 2147)  Labs:  Basename 03/17/12 1739  WBC 11.2*  HGB 12.7  PLT 165  LABCREA --  CREATININE 1.04   Estimated Creatinine Clearance: 60.5 ml/min (by C-G formula based on Cr of 1.04).  Microbiology: No results found for this or any previous visit (from the past 720 hour(s)).  Medical History: Past Medical History  Diagnosis Date  . BRCA1 negative 06/2010  . BRCA2 negative 06/2010  . Vertigo   . Depression   . Thyrotoxicosis   . Benign colon polyp    Medications:  Anti-infectives     Start     Dose/Rate Route Frequency Ordered Stop   03/17/12 2015   cefTRIAXone (ROCEPHIN) 1 g in dextrose 5 % 50 mL IVPB        1 g 100 mL/hr over 30 Minutes Intravenous  Once 03/17/12 2007 03/17/12 2201         Assessment: 46yo female admitted with severe flank pain with fever to 103.3.  She has some mild leukocytosis and CT results reveals kidney stone.  She is being started on IV antibiotics empirically for presumed UTI.  Her creatinine is 1.0 with an estimated clearance ~ 60 ml/min.  She has an allergy to [facility] but not to aminoglycosides.  Given renal function, will utilize once daily dosing and monitor levels to ensure appropriate response.  Goal of Therapy:  Natasha Bence. Trough < 1 mcg/ml  Plan:   Gentamicin 450mg  IV every 24 hours.  Will monitor levels as needed to determine appropriate response.  F/U renal function and adjust as needed.  Nadara Mustard, PharmD., MS Clinical Pharmacist Pager:  [phone]  Thank you for allowing pharmacy to be part of this patients  care team. 03/17/2012,10:38 PM

## 2012-03-17 NOTE — ED Notes (Signed)
MD Urologist at bedside for pt evaluation.

## 2012-03-17 NOTE — Consult Note (Signed)
Urology Consult  Referring physician: Nelida Meuse Reason for referral: Infected stone  Chief Complaint: infected stone  History of Present Illness: patient presents with severe right flank pain with nausea and vomiting No past stone history No UTI history or GU surgery Minimal voiding dysfunction Since she vomited no longer feels shaky Last temp normal Urine does not look particularly infected Modifying factors: There are no other modifying factors  Associated signs and symptoms: There are no other associated signs and symptoms Aggravating and relieving factors: There are no other aggravating or relieving factors Severity: Moderate but improving Duration: Persistent   Past Medical History  Diagnosis Date  . BRCA1 negative 06/2010  . BRCA2 negative 06/2010  . Vertigo   . Depression   . Thyrotoxicosis   . Benign colon polyp    Past Surgical History  Procedure Date  . Dilation and curettage of uterus   . Cesarean section   . Abdominal hysterectomy 04/15/2007    LAVH/RSO  . Lumbar disc surgery     L4-L5  . Tubal ligation   . Tonsillectomy     Medications: I have reviewed the patient's current medications. Allergies:  Allergies  Allergen Reactions  . Omnicef (Cefdinir)     disorientated    Family History  Problem Relation Age of Onset  . Cancer Mother 19    COLON  . Breast cancer Sister 81  . Cancer Father     LYMPHOMA  . Breast cancer Maternal Aunt   . Cancer Maternal Grandmother     BLADDER   Social History:  reports that she quit smoking about 5 years ago. She has never used smokeless tobacco. She reports that she drinks alcohol. She reports that she does not use illicit drugs.  ROS: All systems are reviewed and negative except as noted.   Physical Exam:  Vital signs in last 24 hours: Temp:  [98.1 F (36.7 C)-103.3 F (39.6 C)] 98.1 F (36.7 C) (07/14 2147) Pulse Rate:  [102-140] 102  (07/14 2147) Resp:  [16-20] 16  (07/14 2147) BP:  (111-125)/(61-74) 111/61 mmHg (07/14 2147) SpO2:  [98 %] 98 % (07/14 2147)  Cardiovascular: Skin warm; not flushed Respiratory: Breaths quiet; no shortness of breath Abdomen: No masses Neurological: Normal sensation to touch Musculoskeletal: Normal motor function arms and legs Lymphatics: No inguinal adenopathy Skin: No rashes Genitourinary:Non toxic  NO CVA tenderness  Laboratory Data:  Results for orders placed during the hospital encounter of 03/17/12 (from the past 72 hour(s))  URINALYSIS, ROUTINE W REFLEX MICROSCOPIC     Status: Abnormal   Collection Time   03/17/12  5:14 PM      Component Value Range Comment   Color, Urine YELLOW  YELLOW    APPearance CLEAR  CLEAR    Specific Gravity, Urine 1.015  1.005 - 1.030    pH 5.5  5.0 - 8.0    Glucose, UA NEGATIVE  NEGATIVE mg/dL    Hgb urine dipstick SMALL (*) NEGATIVE    Bilirubin Urine NEGATIVE  NEGATIVE    Ketones, ur 15 (*) NEGATIVE mg/dL    Protein, ur NEGATIVE  NEGATIVE mg/dL    Urobilinogen, UA 0.2  0.0 - 1.0 mg/dL    Nitrite NEGATIVE  NEGATIVE    Leukocytes, UA MODERATE (*) NEGATIVE   URINE MICROSCOPIC-ADD ON     Status: Normal   Collection Time   03/17/12  5:14 PM      Component Value Range Comment   WBC, UA 21-50  <3 WBC/hpf  RBC / HPF 3-6  <3 RBC/hpf    Bacteria, UA RARE  RARE   CBC WITH DIFFERENTIAL     Status: Abnormal   Collection Time   03/17/12  5:39 PM      Component Value Range Comment   WBC 11.2 (*) 4.0 - 10.5 K/uL    RBC 4.08  3.87 - 5.11 MIL/uL    Hemoglobin 12.7  12.0 - 15.0 g/dL    HCT 45.4  09.8 - 11.9 %    MCV 90.2  78.0 - 100.0 fL    MCH 31.1  26.0 - 34.0 pg    MCHC 34.5  30.0 - 36.0 g/dL    RDW 14.7  82.9 - 56.2 %    Platelets 165  150 - 400 K/uL    Neutrophils Relative 95 (*) 43 - 77 %    Neutro Abs 10.7 (*) 1.7 - 7.7 K/uL    Lymphocytes Relative 3 (*) 12 - 46 %    Lymphs Abs 0.3 (*) 0.7 - 4.0 K/uL    Monocytes Relative 2 (*) 3 - 12 %    Monocytes Absolute 0.2  0.1 - 1.0 K/uL     Eosinophils Relative 0  0 - 5 %    Eosinophils Absolute 0.0  0.0 - 0.7 K/uL    Basophils Relative 0  0 - 1 %    Basophils Absolute 0.0  0.0 - 0.1 K/uL   COMPREHENSIVE METABOLIC PANEL     Status: Abnormal   Collection Time   03/17/12  5:39 PM      Component Value Range Comment   Sodium 138  135 - 145 mEq/L    Potassium 3.6  3.5 - 5.1 mEq/L    Chloride 101  96 - 112 mEq/L    CO2 24  19 - 32 mEq/L    Glucose, Bld 120 (*) 70 - 99 mg/dL    BUN 15  6 - 23 mg/dL    Creatinine, Ser 1.30  0.50 - 1.10 mg/dL    Calcium 9.4  8.4 - 86.5 mg/dL    Total Protein 7.1  6.0 - 8.3 g/dL    Albumin 3.6  3.5 - 5.2 g/dL    AST 38 (*) 0 - 37 U/L    ALT 21  0 - 35 U/L    Alkaline Phosphatase 55  39 - 117 U/L    Total Bilirubin 0.5  0.3 - 1.2 mg/dL    GFR calc non Af Amer 63 (*) >90 mL/min    GFR calc Af Amer 73 (*) >90 mL/min    No results found for this or any previous visit (from the past 240 hour(s)). Creatinine:  Basename 03/17/12 1739  CREATININE 1.04    Xrays: See report/chart Proximal Rt 6 mm stone with mild hydro  Impression/Assessment:  Patient has proximal ureteral stone and looks good now I measured her temp and again 87.5 She is non-toxic PATIENT WAS DRINKING WATER WHEN I ENTERED THE ROOM  Plan:  NPO [facility] Stent tomorrow Fluids and antibioitics I do not think patient needs to go to OR tonight and observation is OK and she has been drinking fluids  Deontae Robson A 03/17/2012, 9:57 PM

## 2012-03-17 NOTE — ED Provider Notes (Signed)
History    46 year old female with right flank pain. Onset was this morning. It felt like someone was stabbing her in her side. Intermittent throughout the morning and then subsided but still having a persistent ache. Was on her way [place] the airport when she suddenly began feeling very nauseated and vomited. Diaphoretic and dizzy. Felt like she may pass out. Currently feels a little bit better, but still nauseated. No urinary complaints. No sick contacts. Denies history of kidney stones are similar symptoms. No unusual vaginal bleeding or discharge. Abdominal/pelvic surgical history significant for cesarean section and tubal ligation.  CSN: [id number]  Arrival date & time 03/17/12  1659   First MD Initiated Contact with Patient 03/17/12 1926      Chief Complaint  Patient presents with  . Back Pain    (Consider location/radiation/quality/duration/timing/severity/associated sxs/prior treatment) HPI  Past Medical History  Diagnosis Date  . BRCA1 negative 06/2010  . BRCA2 negative 06/2010  . Vertigo   . Depression   . Thyrotoxicosis   . Benign colon polyp     Past Surgical History  Procedure Date  . Dilation and curettage of uterus   . Cesarean section   . Abdominal hysterectomy 04/15/2007    LAVH/RSO  . Lumbar disc surgery     L4-L5  . Tubal ligation   . Tonsillectomy     Family History  Problem Relation Age of Onset  . Cancer Mother 68    COLON  . Breast cancer Sister 8  . Cancer Father     LYMPHOMA  . Breast cancer Maternal Aunt   . Cancer Maternal Grandmother     BLADDER    History  Substance Use Topics  . Smoking status: Former Smoker    Quit date: 01/15/2007  . Smokeless tobacco: Never Used  . Alcohol Use: Yes     A DRINK A DAY    OB History    Grav Para Term Preterm Abortions TAB SAB Ect Mult Living   2 1   1  1   1       Review of Systems   Review of symptoms negative unless otherwise noted in HPI.   Allergies  Omnicef  Home  Medications   Current Outpatient Rx  Name Route Sig Dispense Refill  . ESTRADIOL 0.52 MG/0.87 GM (0.06%) TD GEL  Apply 1 pump to 1 arm daily. 26 g 1  . KETOPROFEN PO Oral Take 1 capsule by mouth as needed. For migraine    . METHOCARBAMOL 500 MG PO TABS Oral Take 250-500 mg by mouth as needed. For migraine    . MIDODRINE HCL 5 MG PO TABS Oral Take 5 mg by mouth as needed. Extreme headache    . ONE-DAILY MULTI VITAMINS PO TABS Oral Take 1 tablet by mouth daily.      Marland Kitchen PRO-BIOTIC BLEND PO Oral Take by mouth.        BP 114/66  Pulse 115  Temp 99.6 F (37.6 C) (Oral)  Resp 18  SpO2 98%  Physical Exam  Nursing note and vitals reviewed. Constitutional: She appears well-developed and well-nourished. No distress.  HENT:  Head: Normocephalic and atraumatic.  Eyes: Conjunctivae are normal. Right eye exhibits no discharge. Left eye exhibits no discharge.  Neck: Neck supple.  Cardiovascular: Regular rhythm and normal heart sounds.  Exam reveals no gallop and no friction rub.   No murmur heard.      Mildly tachycardic with regular rhythm  Pulmonary/Chest: Effort normal and breath  sounds normal. No respiratory distress.  Abdominal: Soft. She exhibits no distension. There is tenderness.       Mild tenderness in the right lower quadrant without rebound or guarding. No distention.  Musculoskeletal: She exhibits no edema and no tenderness.       Right costovertebral angle tenderness  Neurological: She is alert.  Skin: Skin is warm and dry.  Psychiatric: She has a normal mood and affect. Her behavior is normal. Thought content normal.    ED Course  Procedures (including critical care time)  Labs Reviewed  URINALYSIS, ROUTINE W REFLEX MICROSCOPIC - Abnormal; Notable for the following:    Hgb urine dipstick SMALL (*)     Ketones, ur 15 (*)     Leukocytes, UA MODERATE (*)     All other components within normal limits  CBC WITH DIFFERENTIAL - Abnormal; Notable for the following:    WBC 11.2  (*)     Neutrophils Relative 95 (*)     Neutro Abs 10.7 (*)     Lymphocytes Relative 3 (*)     Lymphs Abs 0.3 (*)     Monocytes Relative 2 (*)     All other components within normal limits  COMPREHENSIVE METABOLIC PANEL - Abnormal; Notable for the following:    Glucose, Bld 120 (*)     AST 38 (*)     GFR calc non Af Amer 63 (*)     GFR calc Af Amer 73 (*)     All other components within normal limits  URINE MICROSCOPIC-ADD ON  URINE CULTURE   Ct Abdomen Pelvis Wo Contrast  03/17/2012  *RADIOLOGY REPORT*  Clinical Data: Back pain and right flank pain  CT ABDOMEN AND PELVIS WITHOUT CONTRAST  Technique:  Multidetector CT imaging of the abdomen and pelvis was performed following the standard protocol without intravenous contrast.  Comparison: None.  Findings: Lung bases are clear.  Mild proximal right hydroureteronephrosis noted to the level of a 6 mm (craniocaudad dimension) calculus within the proximal right ureter.  Trace right lower renal pole perinephric and periureteral fluid noted. 1 mm nonobstructing right upper renal pole calculus image 27.  No radiopaque left renal calculi.  Distal right ureter is decompressed.  Left gonadal vein phlebolith incidentally noted image 62, just superior to the left iliac crest.  If visible at radiography, this could mimic a ureteral calculus in appearance.  Unenhanced liver, gallbladder, adrenal glands, spleen, and pancreas are unremarkable.  No lymphadenopathy or abdominal ascites.  No pelvic free fluid.  Evidence of hysterectomy.  Left ovary is normal.  Right ovary not visualized but no right adnexal mass. Appendix not visualized but no secondary evidence for acute appendicitis is identified.  No acute osseous finding.  IMPRESSION: Proximal right ureteral 6 mm calculus producing mild proximal right hydroureteronephrosis.  Original Report Authenticated By: Harrel Lemon, M.D.     1. Ureteral stone with hydronephrosis   2. UTI (urinary tract infection)     3. Fever   4. Nausea and vomiting       MDM  45y female with right flank and right back pain. Febrile to 103. Nausea and vomiting and chills. UA consistent with infection. CT abdomen pelvis showed a 6 mm proximal right ureteral stone with mild hydronephrosis. Patient received a dose of ceftriaxone. Urology was consulted to evaluate pt.       Raeford Razor, MD 03/17/12 2209

## 2012-03-17 NOTE — ED Notes (Signed)
sts had persistent extreme pain in right back/flank today, vomited, and had the shakes today.

## 2012-03-18 ENCOUNTER — Observation Stay (HOSPITAL_COMMUNITY): Payer: BC Managed Care – PPO

## 2012-03-18 ENCOUNTER — Encounter (HOSPITAL_COMMUNITY): Payer: Self-pay | Admitting: Anesthesiology

## 2012-03-18 ENCOUNTER — Observation Stay (HOSPITAL_COMMUNITY): Payer: BC Managed Care – PPO | Admitting: Anesthesiology

## 2012-03-18 ENCOUNTER — Encounter (HOSPITAL_COMMUNITY)
Admission: EM | Disposition: A | Discharge status: Home / Self Care | Payer: Self-pay | Source: Home / Self Care | Attending: Emergency Medicine

## 2012-03-18 ENCOUNTER — Encounter (HOSPITAL_COMMUNITY): Payer: Self-pay | Admitting: *Deleted

## 2012-03-18 HISTORY — PX: CYSTOSCOPY W/ URETERAL STENT PLACEMENT: SHX1429

## 2012-03-18 LAB — CBC
MCH: 30.7 pg (ref 26.0–34.0)
MCV: 91.2 fL (ref 78.0–100.0)
Platelets: 147 10*3/uL — ABNORMAL LOW (ref 150–400)
RDW: 12.4 % (ref 11.5–15.5)

## 2012-03-18 LAB — BASIC METABOLIC PANEL
BUN: 11 mg/dL (ref 6–23)
Calcium: 8.2 mg/dL — ABNORMAL LOW (ref 8.4–10.5)
Creatinine, Ser: 0.87 mg/dL (ref 0.50–1.10)
GFR calc Af Amer: >90 mL/min (ref >90)

## 2012-03-18 SURGERY — CYSTOSCOPY, WITH RETROGRADE PYELOGRAM AND URETERAL STENT INSERTION
Anesthesia: General | Site: Ureter | Laterality: Right | Wound class: Contaminated

## 2012-03-18 MED ORDER — PROPOFOL 10 MG/ML IV EMUL
INTRAVENOUS | Status: DC | PRN
  Administered 2012-03-18: 200 mg via INTRAVENOUS

## 2012-03-18 MED ORDER — ESTRADIOL 0.52 MG/0.87 GM (0.06%) TD GEL
Freq: Every day | TRANSDERMAL | Status: DC
  Administered 2012-03-18: 1 via TOPICAL
  Filled 2012-03-18: qty 26

## 2012-03-18 MED ORDER — PROMETHAZINE HCL 25 MG/ML IJ SOLN: 6.2500 mg | INTRAMUSCULAR | Status: DC | PRN

## 2012-03-18 MED ORDER — LACTATED RINGERS IV SOLN
INTRAVENOUS | Status: DC
  Administered 2012-03-18: 1000 mL via INTRAVENOUS

## 2012-03-18 MED ORDER — MEPERIDINE HCL 50 MG/ML IJ SOLN
6.2500 mg | INTRAMUSCULAR | Status: DC | PRN
Start: 2012-03-18 — End: 2012-03-19

## 2012-03-18 MED ORDER — SUCCINYLCHOLINE CHLORIDE 20 MG/ML IJ SOLN
INTRAMUSCULAR | Status: DC | PRN
  Administered 2012-03-18: 100 mg via INTRAVENOUS

## 2012-03-18 MED ORDER — HYDROMORPHONE HCL PF 1 MG/ML IJ SOLN: 0.2500 mg | INTRAMUSCULAR | Status: DC | PRN

## 2012-03-18 MED ORDER — FENTANYL CITRATE 0.05 MG/ML IJ SOLN
INTRAMUSCULAR | Status: DC | PRN
  Administered 2012-03-18 (×3): 50 ug via INTRAVENOUS

## 2012-03-18 MED ORDER — VANCOMYCIN HCL 1000 MG IV SOLR
750.0000 mg | Freq: Two times a day (BID) | INTRAVENOUS | Status: DC
  Administered 2012-03-18 – 2012-03-19 (×2): 750 mg via INTRAVENOUS
  Filled 2012-03-18 (×3): qty 750

## 2012-03-18 MED ORDER — DEXAMETHASONE SODIUM PHOSPHATE 10 MG/ML IJ SOLN
INTRAMUSCULAR | Status: DC | PRN
  Administered 2012-03-18: 10 mg via INTRAVENOUS

## 2012-03-18 MED ORDER — LIDOCAINE HCL (CARDIAC) 20 MG/ML IV SOLN
INTRAVENOUS | Status: DC | PRN
  Administered 2012-03-18: 75 mg via INTRAVENOUS

## 2012-03-18 MED ORDER — STERILE WATER FOR IRRIGATION IR SOLN
Status: DC | PRN
  Administered 2012-03-18: 3000 mL

## 2012-03-18 MED ORDER — HYDROCODONE-ACETAMINOPHEN 5-325 MG PO TABS
1.0000 | ORAL_TABLET | Freq: Four times a day (QID) | ORAL | Status: DC | PRN
  Administered 2012-03-18: 1 via ORAL
  Administered 2012-03-19: 2 via ORAL
  Administered 2012-03-19: 1 via ORAL
  Filled 2012-03-18: qty 2
  Filled 2012-03-18 (×3): qty 1

## 2012-03-18 MED ORDER — MIDAZOLAM HCL 5 MG/5ML IJ SOLN
INTRAMUSCULAR | Status: DC | PRN
  Administered 2012-03-18: 0.5 mg via INTRAVENOUS
  Administered 2012-03-18: 1 mg via INTRAVENOUS

## 2012-03-18 MED ORDER — ONDANSETRON HCL 4 MG/2ML IJ SOLN
INTRAMUSCULAR | Status: DC | PRN
  Administered 2012-03-18: 4 mg via INTRAVENOUS

## 2012-03-18 MED ORDER — LACTATED RINGERS IV SOLN
INTRAVENOUS | Status: DC | PRN
  Administered 2012-03-18 (×2): via INTRAVENOUS

## 2012-03-18 MED ORDER — IOHEXOL 300 MG/ML  SOLN
INTRAMUSCULAR | Status: DC | PRN
  Administered 2012-03-18: 10 mL via INTRAVENOUS

## 2012-03-18 MED ORDER — LACTATED RINGERS IV SOLN: INTRAVENOUS | Status: DC

## 2012-03-18 MED ORDER — METOCLOPRAMIDE HCL 5 MG/ML IJ SOLN
INTRAMUSCULAR | Status: DC | PRN
  Administered 2012-03-18: 5 mg via INTRAVENOUS

## 2012-03-18 SURGICAL SUPPLY — 14 items
ADAPTER CATH URET PLST 4-6FR (CATHETERS) ×2 IMPLANT
BAG URO CATCHER STRL LF (DRAPE) ×2 IMPLANT
CATH INTERMIT  6FR 70CM (CATHETERS) ×2 IMPLANT
CLOTH BEACON ORANGE TIMEOUT ST (SAFETY) ×2 IMPLANT
DRAPE CAMERA CLOSED 9X96 (DRAPES) ×2 IMPLANT
GLOVE BIOGEL M STRL SZ7.5 (GLOVE) ×2 IMPLANT
GOWN PREVENTION PLUS XLARGE (GOWN DISPOSABLE) ×2 IMPLANT
GOWN STRL REIN XL XLG (GOWN DISPOSABLE) ×2 IMPLANT
GUIDEWIRE STR DUAL SENSOR (WIRE) ×4 IMPLANT
MANIFOLD NEPTUNE II (INSTRUMENTS) ×2 IMPLANT
MARKER SKIN DUAL TIP RULER LAB (MISCELLANEOUS) ×2 IMPLANT
PACK CYSTO (CUSTOM PROCEDURE TRAY) ×2 IMPLANT
STENT CONTOUR 6FRX24X.038 (STENTS) ×2 IMPLANT
TUBING CONNECTING 10 (TUBING) ×2 IMPLANT

## 2012-03-18 NOTE — Progress Notes (Signed)
Pt advised to send belongings home while in OR. Pt states leaving belongings in room. Pt advised that we are not responsible for items. Verbal understanding from pt.

## 2012-03-18 NOTE — Interval H&P Note (Signed)
History and Physical Interval Note:  03/18/2012 4:29 PM  Dawn Mcmahon  has presented today for surgery, with the diagnosis of right ureteral stone  The various methods of treatment have been discussed with the patient and family. After consideration of risks, benefits and other options for treatment, the patient has consented to  Procedure(s) (LRB): CYSTOSCOPY WITH RETROGRADE PYELOGRAM/URETERAL STENT PLACEMENT (Right) as a surgical intervention .  The patient's history has been reviewed, patient examined, no change in status, stable for surgery.  I have reviewed the patients' chart and labs.  Questions were answered to the patient's satisfaction.     [name] A   

## 2012-03-18 NOTE — Progress Notes (Signed)
UR done. 

## 2012-03-18 NOTE — H&P (View-Only) (Signed)
Vitals normal Laboratory tests and KUB pending Patient alert and stable Pain minimal and well-controlled- right side NPO stent today

## 2012-03-18 NOTE — ED Notes (Signed)
Pt transferred to [facility] via [employer] via Doctor, general practice

## 2012-03-18 NOTE — Progress Notes (Signed)
Vitals normal Laboratory tests and KUB pending Patient alert and stable Pain minimal and well-controlled- right side NPO stent today 

## 2012-03-18 NOTE — Preoperative (Signed)
Beta Blockers   Reason not to administer Beta Blockers:Not Applicable 

## 2012-03-18 NOTE — H&P (Signed)
Urology Consult  Referring physician: Nelida Meuse  Reason for referral: Infected stone  Chief Complaint: infected stone  History of Present Illness: patient presents with severe right flank pain with nausea and vomiting  No past stone history  No UTI history or GU surgery  Minimal voiding dysfunction  Since she vomited no longer feels shaky  Last temp normal  Urine does not look particularly infected  Modifying factors: There are no other modifying factors  Associated signs and symptoms: There are no other associated signs and symptoms  Aggravating and relieving factors: There are no other aggravating or relieving factors  Severity: Moderate but improving  Duration: Persistent  Past Medical History   Diagnosis  Date   .  BRCA1 negative  06/2010   .  BRCA2 negative  06/2010   .  Vertigo    .  Depression    .  Thyrotoxicosis    .  Benign colon polyp     Past Surgical History   Procedure  Date   .  Dilation and curettage of uterus    .  Cesarean section    .  Abdominal hysterectomy  04/15/2007     LAVH/RSO   .  Lumbar disc surgery      L4-L5   .  Tubal ligation    .  Tonsillectomy     Medications: I have reviewed the patient's current medications.  Allergies:  Allergies   Allergen  Reactions   .  Omnicef (Cefdinir)      disorientated    Family History   Problem  Relation  Age of Onset   .  Cancer  Mother  77      COLON    .  Breast cancer  Sister  9   .  Cancer  Father       LYMPHOMA    .  Breast cancer  Maternal Aunt    .  Cancer  Maternal Grandmother       BLADDER    Social History: reports that she quit smoking about 5 years ago. She has never used smokeless tobacco. She reports that she drinks alcohol. She reports that she does not use illicit drugs.  ROS: All systems are reviewed and negative except as noted.  Physical Exam:  Vital signs in last 24 hours:  Temp: [98.1 F (36.7 C)-103.3 F (39.6 C)] 98.1 F (36.7 C) (07/14 2147)  Pulse Rate:  [102-140] 102 (07/14 2147)  Resp: [16-20] 16 (07/14 2147)  BP: (111-125)/(61-74) 111/61 mmHg (07/14 2147)  SpO2: [98 %] 98 % (07/14 2147)  Cardiovascular: Skin warm; not flushed  Respiratory: Breaths quiet; no shortness of breath  Abdomen: No masses  Neurological: Normal sensation to touch  Musculoskeletal: Normal motor function arms and legs  Lymphatics: No inguinal adenopathy  Skin: No rashes  Genitourinary:Non toxic  NO CVA tenderness  Laboratory Data:  Results for orders placed during the hospital encounter of 03/17/12 (from the past 72 hour(s))   URINALYSIS, ROUTINE W REFLEX MICROSCOPIC Status: Abnormal    Collection Time    03/17/12 5:14 PM   Component  Value  Range  Comment    Color, Urine  YELLOW  YELLOW     APPearance  CLEAR  CLEAR     Specific Gravity, Urine  1.015  1.005 - 1.030     pH  5.5  5.0 - 8.0     Glucose, UA  NEGATIVE  NEGATIVE mg/dL     Hgb urine dipstick  SMALL (*)  NEGATIVE     Bilirubin Urine  NEGATIVE  NEGATIVE     Ketones, ur  15 (*)  NEGATIVE mg/dL     Protein, ur  NEGATIVE  NEGATIVE mg/dL     Urobilinogen, UA  0.2  0.0 - 1.0 mg/dL     Nitrite  NEGATIVE  NEGATIVE     Leukocytes, UA  MODERATE (*)  NEGATIVE    URINE MICROSCOPIC-ADD ON Status: Normal    Collection Time    03/17/12 5:14 PM   Component  Value  Range  Comment    WBC, UA  21-50  <3 WBC/hpf     RBC / HPF  3-6  <3 RBC/hpf     Bacteria, UA  RARE  RARE    CBC WITH DIFFERENTIAL Status: Abnormal    Collection Time    03/17/12 5:39 PM   Component  Value  Range  Comment    WBC  11.2 (*)  4.0 - 10.5 K/uL     RBC  4.08  3.87 - 5.11 MIL/uL     Hemoglobin  12.7  12.0 - 15.0 g/dL     HCT  16.1  09.6 - 04.5 %     MCV  90.2  78.0 - 100.0 fL     MCH  31.1  26.0 - 34.0 pg     MCHC  34.5  30.0 - 36.0 g/dL     RDW  40.9  81.1 - 91.4 %     Platelets  165  150 - 400 K/uL     Neutrophils Relative  95 (*)  43 - 77 %     Neutro Abs  10.7 (*)  1.7 - 7.7 K/uL     Lymphocytes Relative  3 (*)  12 - 46 %      Lymphs Abs  0.3 (*)  0.7 - 4.0 K/uL     Monocytes Relative  2 (*)  3 - 12 %     Monocytes Absolute  0.2  0.1 - 1.0 K/uL     Eosinophils Relative  0  0 - 5 %     Eosinophils Absolute  0.0  0.0 - 0.7 K/uL     Basophils Relative  0  0 - 1 %     Basophils Absolute  0.0  0.0 - 0.1 K/uL    COMPREHENSIVE METABOLIC PANEL Status: Abnormal    Collection Time    03/17/12 5:39 PM   Component  Value  Range  Comment    Sodium  138  135 - 145 mEq/L     Potassium  3.6  3.5 - 5.1 mEq/L     Chloride  101  96 - 112 mEq/L     CO2  24  19 - 32 mEq/L     Glucose, Bld  120 (*)  70 - 99 mg/dL     BUN  15  6 - 23 mg/dL     Creatinine, Ser  7.82  0.50 - 1.10 mg/dL     Calcium  9.4  8.4 - 10.5 mg/dL     Total Protein  7.1  6.0 - 8.3 g/dL     Albumin  3.6  3.5 - 5.2 g/dL     AST  38 (*)  0 - 37 U/L     ALT  21  0 - 35 U/L     Alkaline Phosphatase  55  39 - 117 U/L     Total Bilirubin  0.5  0.3 - 1.2  mg/dL     GFR calc non Af Amer  63 (*)  >90 mL/min     GFR calc Af Amer  73 (*)  >90 mL/min     No results found for this or any previous visit (from the past 240 hour(s)).  Creatinine:   Basename  03/17/12 1739   CREATININE  1.04    Xrays:  See report/chart  Proximal Rt 6 mm stone with mild hydro  Impression/Assessment:  Patient has proximal ureteral stone and looks good now  I measured her temp and again 87.5  She is non-toxic  PATIENT WAS DRINKING WATER WHEN I ENTERED THE ROOM  Plan:  NPO  [facility]  Stent tomorrow  Fluids and antibioitics  I do not think patient needs to go to OR tonight and observation is OK and she has been drinking fluids

## 2012-03-18 NOTE — Interval H&P Note (Signed)
History and Physical Interval Note:  03/18/2012 4:29 PM  ANALIS DISTLER  has presented today for surgery, with the diagnosis of right ureteral stone  The various methods of treatment have been discussed with the patient and family. After consideration of risks, benefits and other options for treatment, the patient has consented to  Procedure(s) (LRB): CYSTOSCOPY WITH RETROGRADE PYELOGRAM/URETERAL STENT PLACEMENT (Right) as a surgical intervention .  The patient's history has been reviewed, patient examined, no change in status, stable for surgery.  I have reviewed the patients' chart and labs.  Questions were answered to the patient's satisfaction.     Meleena Munroe A

## 2012-03-18 NOTE — Progress Notes (Signed)
ANTIBIOTIC CONSULT NOTE - INITIAL  Pharmacy Consult for Vancomycin Indication: UTI/ureteral stone  Allergies  Allergen Reactions  . Omnicef (Cefdinir) Other (See Comments)    disoriented    Patient Measurements: Height: 5' 2.5" (158.8 cm) Weight: 145 lb 15.1 oz (66.2 kg) IBW/kg (Calculated) : 51.25  Adjusted Body Weight:    Vital Signs: Temp: 97.5 F (36.4 C) (07/15 1800) Temp src: Oral (07/15 1405) BP: 119/73 mmHg (07/15 1800) Pulse Rate: 109  (07/15 1800) Intake/Output from previous day:   Intake/Output from this shift:    Labs:  [facility] 03/18/12 0835 03/17/12 1739  WBC 14.1* 11.2*  HGB 10.8* 12.7  PLT 147* 165  LABCREA -- --  CREATININE 0.87 1.04   Estimated Creatinine Clearance: 73.1 ml/min (by C-G formula based on Cr of 0.87). No results found for this basename: VANCOTROUGH:2,VANCOPEAK:2,VANCORANDOM:2,GENTTROUGH:2,GENTPEAK:2,GENTRANDOM:2,TOBRATROUGH:2,TOBRAPEAK:2,TOBRARND:2,AMIKACINPEAK:2,AMIKACINTROU:2,AMIKACIN:2, in the last 72 hours   Microbiology: No results found for this or any previous visit (from the past 720 hour(s)).  Medical History: Past Medical History  Diagnosis Date  . BRCA1 negative 06/2010  . BRCA2 negative 06/2010  . Vertigo   . Depression   . Thyrotoxicosis   . Benign colon polyp     Medications:  Scheduled:    . cefTRIAXone (ROCEPHIN)  IV  1 g Intravenous Once  . gentamicin  450 mg Intravenous Q24H  .  HYDROmorphone (DILAUDID) injection  1 mg Intravenous Once  .  HYDROmorphone (DILAUDID) injection  1 mg Intravenous Once  . ondansetron (ZOFRAN) IV  4 mg Intravenous Once  . sodium chloride  1,000 mL Intravenous Once   Infusions:    . sodium chloride 125 mL/hr at 03/18/12 1830  . lactated ringers    . DISCONTD: lactated ringers 1,000 mL (03/18/12 1539)   PRN: HYDROcodone-acetaminophen, HYDROmorphone (DILAUDID) injection, meperidine (DEMEROL) injection, morphine injection, ondansetron, promethazine, DISCONTD: iohexol,  DISCONTD: sterile water for irrigation Assessment: 46 yo female UTI/ureteral stone for cytoscopy and insertion of rt. Ureteral stent.   Goal of Therapy:  Vancomycin trough level 10-15 mcg/ml  Plan:   Begin Vancomycin 750mg  IV q 12 hours  Measure antibiotic drug levels at steady state  Follow up culture results  Loletta Specter 03/18/2012,7:04 PM

## 2012-03-18 NOTE — Transfer of Care (Signed)
Immediate Anesthesia Transfer of Care Note  Patient: Dawn Mcmahon  Procedure(s) Performed: Procedure(s) (LRB): CYSTOSCOPY WITH RETROGRADE PYELOGRAM/URETERAL STENT PLACEMENT (Right)  Patient Location: PACU  Anesthesia Type: General  Level of Consciousness: awake, alert , oriented and patient cooperative  Airway & Oxygen Therapy: Patient Spontanous Breathing and Patient connected to face mask oxygen  Post-op Assessment: Report given to PACU RN, Post -op Vital signs reviewed and stable and Patient moving all extremities  Post vital signs: Reviewed and stable  Complications: No apparent anesthesia complications

## 2012-03-18 NOTE — Anesthesia Postprocedure Evaluation (Signed)
  Anesthesia Post-op Note  Patient: Dawn Mcmahon  Procedure(s) Performed: Procedure(s) (LRB): CYSTOSCOPY WITH RETROGRADE PYELOGRAM/URETERAL STENT PLACEMENT (Right)  Patient Location: PACU  Anesthesia Type: General  Level of Consciousness: awake and alert   Airway and Oxygen Therapy: Patient Spontanous Breathing  Post-op Pain: mild  Post-op Assessment: Post-op Vital signs reviewed, Patient's Cardiovascular Status Stable, Respiratory Function Stable, Patent Airway and No signs of Nausea or vomiting  Post-op Vital Signs: stable  Complications: No apparent anesthesia complications

## 2012-03-18 NOTE — Op Note (Signed)
Preoperative diagnosis: Right ureteral stone and possible urinary tract infection Postoperative diagnosis: Right ureteral stone and possible urinary tract infection Surgery: Cystoscopy right retrograde ureterogram and insertion of right ureteral stent Surgeon: Dr. Lorin Picket Maleik Vanderzee  The patient has the above diagnoses. He remained afebrile in the hospital but I must say felt a little bit poorly and looked poorly before the case this afternoon. The case was delayed to to surgical scheduling. She been covered with gentamicin. Her white blood count was mildly elevated today. Her creatinine was normal  Patient was prepped and draped in usual fashion. 19 [place] scope was utilized. Bladder mucosa and trigone were normal. There was no periureteral edema  Under fluoroscopic and cystoscopic to pass a 6 [place] open-ended ureteral catheter to the lobe mid right ureter and did a gentle retrograde ureterogram was 7 cc of contrast. I did not see a definite filling defect and she had minimal hydronephrosis.  Sensor wire was easily placed curling in the upper pole calyx. I remove the open-end ureteral catheter. I passed a 24 cm x 6 French double-J stent without a string curling in the renal calyx and in the bladder. Bladder was emptied. Patient was taken to recovery room will observe her overnight.

## 2012-03-18 NOTE — Anesthesia Preprocedure Evaluation (Signed)
Anesthesia Evaluation  Patient identified by MRN, date of birth, ID band Patient awake    Reviewed: Allergy & Precautions, H&P , NPO status , Patient's Chart, lab work & pertinent test results  Airway Mallampati: II TM Distance: >3 FB Neck ROM: Full    Dental No notable dental hx.    Pulmonary neg pulmonary ROS,  breath sounds clear to auscultation  Pulmonary exam normal       Cardiovascular negative cardio ROS  Rhythm:Regular Rate:Normal     Neuro/Psych negative neurological ROS  negative psych ROS   GI/Hepatic negative GI ROS, Neg liver ROS,   Endo/Other  negative endocrine ROSHyperthyroidism   Renal/GU negative Renal ROS  negative genitourinary   Musculoskeletal negative musculoskeletal ROS (+)   Abdominal   Peds negative pediatric ROS (+)  Hematology negative hematology ROS (+)   Anesthesia Other Findings   Reproductive/Obstetrics negative OB ROS                           Anesthesia Physical Anesthesia Plan  ASA: II  Anesthesia Plan: General   Post-op Pain Management:    Induction: Intravenous  Airway Management Planned:   Additional Equipment:   Intra-op Plan:   Post-operative Plan: Extubation in OR  Informed Consent: I have reviewed the patients History and Physical, chart, labs and discussed the procedure including the risks, benefits and alternatives for the proposed anesthesia with the patient or authorized representative who has indicated his/her understanding and acceptance.   Dental advisory given  Plan Discussed with: CRNA  Anesthesia Plan Comments:         Anesthesia Quick Evaluation

## 2012-03-19 ENCOUNTER — Encounter (HOSPITAL_COMMUNITY): Payer: Self-pay | Admitting: Urology

## 2012-03-19 LAB — URINE CULTURE

## 2012-03-19 LAB — CBC
HCT: 31.9 % — ABNORMAL LOW (ref 36.0–46.0)
MCH: 31.3 pg (ref 26.0–34.0)
MCHC: 33.5 g/dL (ref 30.0–36.0)
MCV: 93.3 fL (ref 78.0–100.0)
Platelets: 172 10*3/uL (ref 150–400)
RDW: 12.1 % (ref 11.5–15.5)

## 2012-03-19 MED ORDER — HYDROCODONE-ACETAMINOPHEN 5-500 MG PO TABS: 1.0000 | ORAL_TABLET | Freq: Four times a day (QID) | ORAL | Status: AC | PRN

## 2012-03-19 MED ORDER — CIPROFLOXACIN HCL 250 MG PO TABS: 250.0000 mg | ORAL_TABLET | Freq: Two times a day (BID) | ORAL | Status: AC

## 2012-03-19 NOTE — Discharge Summary (Signed)
Physician Discharge Summary  Patient ID: Dawn Mcmahon MRN: [id number] DOB/AGE: 47-Sep-1967 47 y.o.  Admit date: 03/17/2012 Discharge date: 03/19/2012  Admission Diagnoses: ureteral stone and low grade UTI  Discharge Diagnoses:  Active Problems:  * No active hospital problems. *    Discharged Condition: good  Hospital Course: stable; had right stent inserted; no fever; mild RLQ pain  Consults: None  Significant Diagnostic Studies: KUB   Treatments: antibiotics: vancomycin and gentamycin and hydration  Discharge Exam: Blood pressure 122/77, pulse 67, temperature 97.8 F (36.6 C), temperature source Oral, resp. rate 16, height 5' 2.5" (1.588 m), weight 66.2 kg (145 lb 15.1 oz), SpO2 96.00%. General appearance: alert  Disposition: Home on cipro and vicoden and f/up in clinic   Medication List  As of 03/19/2012  7:47 AM   TAKE these medications         ciprofloxacin 250 MG tablet   Commonly known as: CIPRO   Take 1 tablet (250 mg total) by mouth 2 (two) times daily.      Estradiol 0.52 MG/0.87 GM (0.06%) Gel   Apply 1 pump to 1 arm daily.      HYDROcodone-acetaminophen 5-500 MG per tablet   Commonly known as: VICODIN   Take 1 tablet by mouth every 6 (six) hours as needed for pain.      KETOPROFEN PO   Take 1 capsule by mouth as needed. For migraine      methocarbamol 500 MG tablet   Commonly known as: ROBAXIN   Take 250-500 mg by mouth as needed. For migraine      midodrine 5 MG tablet   Commonly known as: PROAMATINE   Take 5 mg by mouth as needed. Extreme headache      multivitamin tablet   Take 1 tablet by mouth daily.      PRO-BIOTIC BLEND PO   Take by mouth.           Follow-up Information    Follow up with Reign Dziuba A, MD. (as scheduled)    Contact information:   [address] [facility] [facility] [address] [facility] [place] [address] [phone]          Signed: Martina Sinner 03/19/2012, 7:47 AM

## 2012-03-19 NOTE — Progress Notes (Signed)
Vitals normal Laboratory tests pending Patient alert and stable Pain minimal and well-controlled-RLQ  Post treatment course discussed in detail Followup discussed in detail See orders

## 2012-03-25 ENCOUNTER — Other Ambulatory Visit: Payer: Self-pay | Admitting: Urology

## 2012-03-28 ENCOUNTER — Encounter (HOSPITAL_COMMUNITY): Payer: Self-pay | Admitting: *Deleted

## 2012-04-02 ENCOUNTER — Encounter (HOSPITAL_COMMUNITY): Payer: Self-pay | Admitting: Pharmacy Technician

## 2012-04-03 NOTE — H&P (Signed)
History of Present Illness   Dawn Mcmahon had a proximal right ureteral stone 6 mm in size with right flank pain and she spiked a fever which quickly normalized in the emergency room. She was here with a follow-up KUB.  Review of systems: No change in bowel or neurologic status.   Urinalysis: Few bacteria. Urine was sent for culture.   Dawn Mcmahon says she has not had any fever whatsoever in the last 3-4 days. She has some mild right lower quadrant discomfort not necessarily worse when she urinates. She has a more urgent bladder but she can hold urination for approximately 2 hours. She has not seen blood in the urine.   I reviewed Dawn Mcmahon's KUB. In my opinion the stone has been pushed in the lower pole calyx. I reviewed her CT scan report and her CT films. She did not have a stone in the lower pole of her right kidney and one could argue she had a 1 mm nonobstructing stone in the upper pole of the right kidney.    Past Medical History Problems  1. History of  Heartburn 787.1  Surgical History Problems  1. History of  Back Surgery 2. History of  Hysterectomy V45.77 3. History of  Tonsillectomy 4. History of  Tubal Ligation V25.2  Current Meds 1. Ciprofloxacin HCl 250 MG Oral Tablet; Therapy: 16Jul2013 to 2. Hydrocodone-Acetaminophen 7.5-325 MG Oral Tablet; TAKE 2 TABLETS EVERY 4 TO 6 HOURS  AS NEEDED FOR PAIN; Therapy: 19Jul2013 to (Evaluate:23Jul2013); Last Rx:19Jul2013 3. Ketoprofen 75 MG Oral Capsule; Therapy: 05Jul2013 to 4. Methocarbamol 500 MG Oral Tablet; Therapy: 05Jul2013 to 5. Midrin 325-65-100 MG CAPS; Therapy: (Recorded:22Jul2013) to  Allergies Medication  1. Omnicef CAPS  Family History Problems  1. Sororal history of  Breast Cancer V16.3 2. Paternal history of  Cancer 3. Maternal history of  Colon Cancer V16.0  Social History Problems  1. Caffeine Use 2. Former Smoker V15.82 3. Marital History - Single 4. Occupation: CUSTOMER SERVICE Denied  5. History of   Former Smoker  Vitals Vital Signs [Data Includes: Last 1 Day]  22Jul2013 11:18AM  BMI Calculated: 25.48 BSA Calculated: 1.66 Height: 5 ft 2.5 in Weight: 142 lb  Blood Pressure: 148 / 95 Temperature: 98.2 F Heart Rate: 81  Results/Data  Selected Results  UA With REFLEX 22Jul2013 11:09AM Dawn Mcmahon   Test Name Result Flag Reference  COLOR YELLOW  YELLOW  APPEARANCE CLEAR  CLEAR  SPECIFIC GRAVITY 1.015  1.005-1.030  pH 5.5  5.0-8.0  GLUCOSE NEG mg/dL  NEG  BILIRUBIN NEG  NEG  KETONE NEG mg/dL  NEG  BLOOD LARGE A NEG  PROTEIN TRACE mg/dL  NEG  UROBILINOGEN 0.2 mg/dL  1.6-1.0  NITRITE NEG  NEG  LEUKOCYTE ESTERASE TRACE A NEG  SQUAMOUS EPITHELIAL/HPF RARE  RARE  WBC 0-2 WBC/hpf  <3  RBC 7-10 RBC/hpf A <3  BACTERIA FEW A RARE  CRYSTALS NONE SEEN  NONE SEEN  CASTS NONE SEEN  NONE SEEN   Assessment Assessed  1. Ureteral Stone 592.1  Plan   Discussion/Summary   I do not think she should have lithotripsy until next week because she had a "infection". I talked about lithotripsy.  We talked about ESWL in detail. Pros, cons, general surgical and anesthetic risks, and other options including watchful waiting and ureteroscopy were discussed. Success and failure rates and need for further/repeat therapy were discussed. Risks were described but not limited to pain, infection, sepsis, and bleeding. The risk of renal and  ureteral trauma with short and Mcmahon term sequelae was discussed. The risk of injury to adjacent structures was discussed. The risk of needing a stent post-ESWL was discussed.  She said her antibiotics finish tomorrow and I think clinically this is fine. I will call her if the culture is positive.  After a thorough review of the management options for the patient's condition the patient  elected to proceed with surgical therapy as noted above. We have discussed the potential benefits and risks of the procedure, side effects of the proposed treatment, the  likelihood of the patient achieving the goals of the procedure, and any potential problems that might occur during the procedure or recuperation. Informed consent has been obtained.

## 2012-04-04 ENCOUNTER — Ambulatory Visit (HOSPITAL_COMMUNITY)
Admission: RE | Admit: 2012-04-04 | Discharge: 2012-04-04 | Disposition: A | Discharge status: Home / Self Care | Payer: BC Managed Care – PPO | Source: Ambulatory Visit | Attending: Urology | Admitting: Urology

## 2012-04-04 ENCOUNTER — Encounter (HOSPITAL_COMMUNITY): Payer: Self-pay | Admitting: *Deleted

## 2012-04-04 ENCOUNTER — Ambulatory Visit (HOSPITAL_COMMUNITY): Payer: BC Managed Care – PPO

## 2012-04-04 ENCOUNTER — Encounter (HOSPITAL_COMMUNITY)
Admission: RE | Disposition: A | Discharge status: Home / Self Care | Payer: Self-pay | Source: Ambulatory Visit | Attending: Urology

## 2012-04-04 DIAGNOSIS — N201 Calculus of ureter: Secondary | ICD-10-CM | POA: Insufficient documentation

## 2012-04-04 DIAGNOSIS — Z79899 Other long term (current) drug therapy: Secondary | ICD-10-CM | POA: Insufficient documentation

## 2012-04-04 DIAGNOSIS — Z90710 Acquired absence of both cervix and uterus: Secondary | ICD-10-CM | POA: Insufficient documentation

## 2012-04-04 DIAGNOSIS — R509 Fever, unspecified: Secondary | ICD-10-CM | POA: Insufficient documentation

## 2012-04-04 DIAGNOSIS — R12 Heartburn: Secondary | ICD-10-CM | POA: Insufficient documentation

## 2012-04-04 HISTORY — PX: LITHOTRIPSY: SUR834

## 2012-04-04 HISTORY — DX: Chronic kidney disease, unspecified: N18.9

## 2012-04-04 SURGERY — LITHOTRIPSY, ESWL
Anesthesia: LOCAL | Laterality: Right

## 2012-04-04 MED ORDER — DIPHENHYDRAMINE HCL 25 MG PO CAPS
25.0000 mg | ORAL_CAPSULE | ORAL | Status: AC
  Administered 2012-04-04: 25 mg via ORAL
  Filled 2012-04-04: qty 1

## 2012-04-04 MED ORDER — CIPROFLOXACIN HCL 500 MG PO TABS
500.0000 mg | ORAL_TABLET | ORAL | Status: AC
  Administered 2012-04-04: 500 mg via ORAL
  Filled 2012-04-04: qty 1

## 2012-04-04 MED ORDER — DEXTROSE-NACL 5-0.45 % IV SOLN: INTRAVENOUS | Status: DC

## 2012-04-04 MED ORDER — DIAZEPAM 5 MG PO TABS
10.0000 mg | ORAL_TABLET | ORAL | Status: AC
  Administered 2012-04-04: 10 mg via ORAL
  Filled 2012-04-04: qty 2

## 2012-04-04 NOTE — Progress Notes (Signed)
0700 Took laxative 04-03-12 good results, no Aspirin,Advil,Toradol in the past 72 hours

## 2012-04-04 NOTE — Interval H&P Note (Signed)
History and Physical Interval Note:  04/04/2012 7:09 AM  Dawn Mcmahon  has presented today for surgery, with the diagnosis of Right Ureteral Stone  The various methods of treatment have been discussed with the patient and family. After consideration of risks, benefits and other options for treatment, the patient has consented to  Procedure(s) (LRB): EXTRACORPOREAL SHOCK WAVE LITHOTRIPSY (ESWL) (Right) as a surgical intervention .  The patient's history has been reviewed, patient examined, no change in status, stable for surgery.  I have reviewed the patient's chart and labs.  Questions were answered to the patient's satisfaction.     [name] A

## 2012-04-04 NOTE — Progress Notes (Signed)
0700 Ate a light dinner

## 2012-05-10 ENCOUNTER — Telehealth: Payer: Self-pay | Admitting: *Deleted

## 2012-05-10 MED ORDER — ESTRADIOL 0.52 MG/0.87 GM (0.06%) TD GEL: TRANSDERMAL | Status: DC

## 2012-05-10 NOTE — Telephone Encounter (Signed)
Pt called requesting refill on elestrin 0.06%, rx sent no refills . Annual due on 05/22/12

## 2012-05-14 ENCOUNTER — Telehealth: Payer: Self-pay | Admitting: *Deleted

## 2012-05-14 MED ORDER — ESTRADIOL 0.52 MG/0.87 GM (0.06%) TD GEL: TRANSDERMAL | Status: DC

## 2012-05-14 NOTE — Telephone Encounter (Signed)
Pt would like elestrin 0.06% sent to [facility]. Pt would like brand rx, not generic. rx sent,annual scheduled on 05/22/12

## 2012-05-22 ENCOUNTER — Ambulatory Visit (INDEPENDENT_AMBULATORY_CARE_PROVIDER_SITE_OTHER): Payer: BC Managed Care – PPO | Admitting: Gynecology

## 2012-05-22 ENCOUNTER — Encounter: Payer: Self-pay | Admitting: Gynecology

## 2012-05-22 VITALS — BP 120/80 | Ht 62.0 in | Wt 141.0 lb

## 2012-05-22 DIAGNOSIS — Z800 Family history of malignant neoplasm of digestive organs: Secondary | ICD-10-CM

## 2012-05-22 DIAGNOSIS — Z79890 Hormone replacement therapy (postmenopausal): Secondary | ICD-10-CM

## 2012-05-22 DIAGNOSIS — Z01419 Encounter for gynecological examination (general) (routine) without abnormal findings: Secondary | ICD-10-CM

## 2012-05-22 DIAGNOSIS — Z113 Encounter for screening for infections with a predominantly sexual mode of transmission: Secondary | ICD-10-CM

## 2012-05-22 DIAGNOSIS — N200 Calculus of kidney: Secondary | ICD-10-CM | POA: Insufficient documentation

## 2012-05-22 DIAGNOSIS — Z803 Family history of malignant neoplasm of breast: Secondary | ICD-10-CM | POA: Insufficient documentation

## 2012-05-22 DIAGNOSIS — E079 Disorder of thyroid, unspecified: Secondary | ICD-10-CM

## 2012-05-22 MED ORDER — ESTRADIOL 0.52 MG/0.87 GM (0.06%) TD GEL: 1.0000 "application " | Freq: Every day | TRANSDERMAL | Status: DC

## 2012-05-22 NOTE — Progress Notes (Signed)
Dawn Mcmahon 07-Jul-1966 [id number]   History:    46 y.o. For annual exam with no complaints. Patient wishing to have an STD screen. Patient last year was treated by her primary physician for vertigo. Patient also has been followed by an endocrinologist Dr. 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 She is no longer any thyroid medication scheduled to see endocrinologist at the end of the year. Patient has a history of LAVH BSO in 2008. She has a mother who had colon cancer at the age of 42 . Patient had a colonoscopy in 2012 and benign polyp was detected. Her sister had breast cancer at the age of 47 as well as an aunt who also had breast cancer. Patient's last mammogram was normal October 2012. The patient was tested for the BRCA1/BRCA2 mutation and none were detected in October of 2011. She has history of a right breast biopsy which was benign. Patient with no prior history of abnormal Pap smears. Last year patient was found to have an elevated FSH and was started on elestrin 0.06% transdermal gel which she applies to one on the only daily. This has worked well for her vasomotor symptoms.  Past medical history,surgical history, family history and social history were all reviewed and documented in the EPIC chart.  Gynecologic History No LMP recorded. Patient has had a hysterectomy. Contraception: Prior hysterectomy Last Pap: 2011. Results were: normal Last mammogram: 2012. Results were: normal  Obstetric History OB History    Grav Para Term Preterm Abortions TAB SAB Ect Mult Living   2 1 1  1  1   1      # Outc Date GA Lbr Len/2nd Wgt Sex Del Anes PTL Lv   1 TRM     F CS  No Yes   2 SAB                ROS: A ROS was performed and pertinent positives and negatives are included in the history.  GENERAL: No fevers or chills. HEENT: No change in vision, no earache, sore throat or sinus congestion. NECK: No pain or stiffness. CARDIOVASCULAR: No  chest pain or pressure. No palpitations. PULMONARY: No shortness of breath, cough or wheeze. GASTROINTESTINAL: No abdominal pain, nausea, vomiting or diarrhea, melena or bright red blood per rectum. GENITOURINARY: No urinary frequency, urgency, hesitancy or dysuria. MUSCULOSKELETAL: No joint or muscle pain, no back pain, no recent trauma. DERMATOLOGIC: No rash, no itching, no lesions. ENDOCRINE: No polyuria, polydipsia, no heat or cold intolerance. No recent change in weight. HEMATOLOGICAL: No anemia or easy bruising or bleeding. NEUROLOGIC: No headache, seizures, numbness, tingling or weakness. PSYCHIATRIC: No depression, no loss of interest in normal activity or change in sleep pattern.     Exam: chaperone present  BP 120/80  Ht 5\' 2"  (1.575 m)  Wt 141 lb (63.957 kg)  BMI 25.79 kg/m2  Body mass index is 25.79 kg/(m^2).  General appearance : Well developed well nourished female. No acute distress HEENT: Neck supple, trachea midline, no carotid bruits, no thyroidmegaly Lungs: Clear to auscultation, no rhonchi or wheezes, or rib retractions  Heart: Regular rate and rhythm, no murmurs or gallops Breast:Examined in sitting and supine position were symmetrical in appearance, no palpable masses or tenderness,  no skin retraction, no nipple inversion, no nipple discharge, no skin discoloration, no axillary or supraclavicular lymphadenopathy Abdomen: no palpable masses or tenderness, no rebound or guarding Extremities: no edema  or skin discoloration or tenderness  Pelvic:  Bartholin, Urethra, Skene Glands: Within normal limits             Vagina: No gross lesions or discharge  Cervix: Absent  Uterus: Absent  Adnexa  Without masses or tenderness  Anus and perineum  normal   Rectovaginal  normal sphincter tone without palpated masses or tenderness             Hemoccult cards provided to submit to the office for testing     Assessment/Plan:  46 y.o. female for annual exam. Patient doing well  on elestrin 0.06% which she applies transdermally to one arm daily it is help her vasomotor symptoms. We discussed the new Pap smear screening guidelines since she had a hysterectomy and no prior history of abnormal Pap smears she will no longer needs Pap smears. She wanted an STD screen soy GC and Chlamydia culture was obtained today. She'll stop by the lab we'll check an RPR, HIV, hepatitis B and C. as well as a blood sugar, cholesterol, TSH, CBC and urinalysis. She was reminded to follow up with Dr. Lurene Shadow endocrinologist at the end of the year and to schedule her mammogram as well. She was reminded do her monthly self breast examination. Issues months ago to the office Hemoccult cards for testing.   Ok Edwards MD, 5:00 PM 05/22/2012

## 2012-05-22 NOTE — Patient Instructions (Addendum)
Diphtheria, Tetanus, and Pertussis (DTaP) Vaccine What You Need to Know WHY GET VACCINATED? Diphtheria, tetanus, and pertussis are serious diseases caused by bacteria. Diphtheria and pertussis are spread from person to person. Tetanus enters the body through cuts or wounds. Diphtheria causes a thick covering in the back of the throat.  It can lead to breathing problems, paralysis, heart failure, and even death.  Tetanus (Lockjaw) causes painful tightening of the muscles, usually all over the body.  It can lead to "locking" of the jaw so the victim cannot open his or her mouth or swallow. Tetanus leads to death in about 2 out of 10 cases.  Pertussis (Whooping Cough) causes coughing spells so bad that it is hard for infants to eat, drink, or breathe. These spells can last for weeks.  It can lead to pneumonia, seizures (jerking and staring spells), brain damage, and death.  Diphtheria, tetanus, and pertussis vaccine (DTaP) can help prevent these diseases. Most children who are vaccinated with DTaP will be protected throughout childhood. Many more children would get these diseases if we stopped vaccinating. DTaP is a safer version of an older vaccine called DTP. DTP is no longer used in the [place]. WHO SHOULD GET DTAP VACCINE AND WHEN? Children should get 5 doses of DTaP vaccine, 1 dose at each of the following ages:  2 months.   4 months.   6 months.   15 to 18 months.   4 to 6 years.  DTaP may be given at the same time as other vaccines. SOME CHILDREN SHOULD NOT GET DTAP VACCINE OR SHOULD WAIT  Children with minor illnesses, such as a cold, may be vaccinated. But children who are moderately or severely ill should usually wait until they recover before getting DTaP vaccine.   Any child who had a life-threatening allergic reaction after a dose of DTaP should not get another dose.   Any child who suffered a brain or nervous system disease within 7 days after a dose of DTaP should  not get another dose.   Talk with your caregiver if your child:   Had a seizure or collapsed after a dose of DTaP.   Cried non-stop for 3 hours or more after a dose of DTaP.   Had a fever over 105 F (40.6 C) after a dose of DTaP.   Ask your caregiver for more information. Some of these children should not get another dose of pertussis vaccine, but may get a vaccine without pertussis, called DT.  OLDER CHILDREN AND ADULTS  DTaP is not licensed for adolescents, adults, or children 1 years of age and older.   But older people still need protection. A vaccine called Tdap is similar to DTaP. A single dose of Tdap is recommended for people 11 through 47 years of age. Another vaccine, called Td, protects against tetanus and diphtheria, but not pertussis. It is recommended every 10 years.  WHAT ARE THE RISKS FROM DTAP VACCINE?  Getting diphtheria, tetanus, or pertussis disease is much riskier than getting DTaP vaccine.   However, a vaccine, like any medicine, is capable of causing serious problems, such as severe allergic reactions. The risk of DTaP vaccine causing serious harm, or death, is extremely small.  Mild Problems (Common)  Fever (up to about 1 child in 4).   Redness or swelling where the shot was given (up to about 1 child in 4).   Soreness or tenderness where the shot was given (up to about 1 child in 4).  These problems occur more often after the 4th and 5th doses of the DTaP series than after earlier doses. Sometimes the 4th or 5th dose of DTaP vaccine is followed by swelling of the entire arm or leg in which the shot was given, lasting 1 to 7 days (up to about 1 child in 30). Other mild problems include:  Fussiness (up to about 1 child in 3).   Tiredness or poor appetite (up to about 1 child in 10).   Vomiting (up to about 1 child in 50).  These problems generally occur 1 to 3 days after the shot. Moderate Problems (Uncommon)  Seizure (jerking or staring) (about 1 child  out of 14,000).   Non-stop crying, for 3 hours or more (up to about 1 child out of 1,000).   High fever, over 105 F (40.6 C) (about 1 child out of 16,000).  Severe Problems (Very Rare)  Serious allergic reaction (less than 1 out of a million doses).   Several other severe problems have been reported after DTaP vaccine. These include:   Long-term seizures, coma, or lowered consciousness.   Permanent brain damage.  These are so rare it is hard to tell if they are caused by the vaccine. Controlling fever is especially important for children who have had seizures, for any reason. It is also important if another family member has had seizures. You can reduce fever and pain by giving your child an aspirin-free pain reliever when the shot is given, and for the next 24 hours, following the package instructions. WHAT IF THERE IS A MODERATE OR SEVERE REACTION? What should I look for? Any unusual conditions, such as a serious allergic reaction, high fever, or unusual behavior. Serious allergic reactions are extremely rare with any vaccine. If one were to occur, it would most likely be within a few minutes to a few hours after the shot. Signs can include difficulty breathing, hoarseness or wheezing, hives, paleness, weakness, a fast heartbeat, or dizziness. If a high fever or seizure were to occur, it would usually be within a week after the shot. What should I do?  Call your caregiver or get the person to a caregiver right away.   Tell the caregiver what happened, the date and time it happened, and when the vaccination was given.   Ask the caregiver, nurse, or health department to file a Vaccine Adverse Event Reporting System (VAERS) form. Or, you can file this report through the VAERS website at www.vaers.LAgents.no or by calling 1-(563)628-9859.  VAERS does not provide medical advice. THE NATIONAL VACCINE INJURY COMPENSATION PROGRAM  In the rare event that you or your child has a serious reaction  to a vaccine, a federal program has been created to help you pay for the care of those who have been harmed.   For details about the National Vaccine Injury Compensation Program, call 315-830-0253 or visit the program's website at SpiritualWord.at  HOW CAN I LEARN MORE?  Ask your caregiver. They can give you the vaccine package insert or suggest other sources of information.   Call your local or state health department's immunization program.   Contact the Centers for Disease Control and Prevention (CDC):   Call 778-515-9892 (1-800-CDC-INFO).   Visit the [employer] at PicCapture.uy  CDC Diphtheria, Tetanus, and Pertussis (DTaP) Vaccine VIS (01/18/06) Document Released: 06/18/2006 Document Revised: 08/10/2011 Document Reviewed: 06/18/2006 [facility] Patient Information 2012 [place], [place].

## 2012-05-23 ENCOUNTER — Encounter: Payer: Self-pay | Admitting: Gynecology

## 2012-05-23 LAB — CBC WITH DIFFERENTIAL/PLATELET
Basophils Absolute: 0 10*3/uL (ref 0.0–0.1)
Basophils Relative: 0 % (ref 0–1)
Hemoglobin: 13.3 g/dL (ref 12.0–15.0)
Lymphocytes Relative: 33 % (ref 12–46)
Lymphs Abs: 1.8 10*3/uL (ref 0.7–4.0)
MCH: 30.9 pg (ref 26.0–34.0)
Monocytes Relative: 9 % (ref 3–12)
Neutro Abs: 3 10*3/uL (ref 1.7–7.7)
Neutrophils Relative %: 55 % (ref 43–77)
RBC: 4.3 MIL/uL (ref 3.87–5.11)

## 2012-05-23 LAB — HIV ANTIBODY (ROUTINE TESTING W REFLEX): HIV: NONREACTIVE

## 2012-05-23 LAB — GC/CHLAMYDIA PROBE AMP, GENITAL: Chlamydia, DNA Probe: NEGATIVE

## 2012-05-28 ENCOUNTER — Encounter: Payer: Self-pay | Admitting: Gynecology

## 2012-06-13 ENCOUNTER — Other Ambulatory Visit: Payer: Self-pay | Admitting: Gynecology

## 2012-06-13 ENCOUNTER — Other Ambulatory Visit: Payer: Self-pay | Admitting: Anesthesiology

## 2012-06-13 DIAGNOSIS — Z1211 Encounter for screening for malignant neoplasm of colon: Secondary | ICD-10-CM

## 2012-07-04 ENCOUNTER — Encounter: Payer: Self-pay | Admitting: Gynecology

## 2012-08-06 ENCOUNTER — Encounter: Payer: Self-pay | Admitting: Gynecology

## 2012-08-14 ENCOUNTER — Ambulatory Visit (INDEPENDENT_AMBULATORY_CARE_PROVIDER_SITE_OTHER): Payer: BC Managed Care – PPO | Admitting: General Surgery

## 2012-08-14 ENCOUNTER — Encounter (INDEPENDENT_AMBULATORY_CARE_PROVIDER_SITE_OTHER): Payer: Self-pay | Admitting: General Surgery

## 2012-08-14 VITALS — BP 120/72 | HR 76 | Temp 98.4°F | Resp 18 | Ht 63.0 in | Wt 137.4 lb

## 2012-08-14 DIAGNOSIS — K644 Residual hemorrhoidal skin tags: Secondary | ICD-10-CM

## 2012-08-14 NOTE — Progress Notes (Signed)
Chief Complaint  Patient presents with  . New Evaluation    ext Hems    HISTORY: Dawn Mcmahon is a 46 y.o. female who presents to the office with rectal bleeding.  Other symptoms include itching and anal iritation.  She  has tried Prep H in the past with little success.  Straining makes the symptoms worse.  This had been occurring for years.  It is intermittent in nature.  Her bowel habits are regular and her bowel movements are varied.  Her fiber intake is moderate.  Her last colonoscopy was last year with Dr Loreta Ave and she had a polyp removed.  She denies any prolapsing tissue.      Past Medical History  Diagnosis Date  . BRCA1 negative 06/2010  . BRCA2 negative 06/2010  . Vertigo   . Depression   . Thyrotoxicosis   . Benign colon polyp   . Chronic kidney disease     kidney stone  . Kidney stone       Past Surgical History  Procedure Date  . Dilation and curettage of uterus   . Cesarean section   . Abdominal hysterectomy 04/15/2007    LAVH/RSO  . Lumbar disc surgery     L4-L5  . Tubal ligation   . Tonsillectomy   . Cystoscopy w/ ureteral stent placement 03/18/2012    Procedure: CYSTOSCOPY WITH RETROGRADE PYELOGRAM/URETERAL STENT PLACEMENT;  Surgeon: Martina Sinner, MD;  Location: [facility];  Service: Urology;  Laterality: Right;  . Lithotripsy AUGUST 2013        Current Outpatient Prescriptions  Medication Sig Dispense Refill  . Estradiol 0.52 MG/0.87 GM (0.06%) GEL Apply 1 application topically daily. Apply to one arm daily only  1 Bottle  11  . KETOPROFEN PO Take 1 capsule by mouth as needed. For migraine      . methocarbamol (ROBAXIN) 500 MG tablet Take 250-500 mg by mouth as needed. For migraine      . midodrine (PROAMATINE) 5 MG tablet Take 5 mg by mouth as needed. Extreme headache      . Multiple Vitamin (MULTIVITAMIN) tablet Take 1 tablet by mouth daily.        . Probiotic Product (PRO-BIOTIC BLEND PO) Take 1 tablet by mouth every morning.       Marland Kitchen  HYDROcodone-acetaminophen (NORCO) 7.5-325 MG per tablet Take 2 tablets by mouth every 4 (four) hours as needed.          Allergies  Allergen Reactions  . Omnicef (Cefdinir) Other (See Comments)    disoriented  . Tramadol Nausea And Vomiting      Family History  Problem Relation Age of Onset  . Cancer Mother 82    COLON  . Breast cancer Sister 58  . Cancer Father     LYMPHOMA  . Breast cancer Maternal Aunt   . Cancer Maternal Grandmother     BLADDER    History   Social History  . Marital Status: Single    Spouse Name: N/A    Number of Children: N/A  . Years of Education: N/A   Social History Main Topics  . Smoking status: Former Smoker    Quit date: 01/15/2007  . Smokeless tobacco: Never Used  . Alcohol Use: Yes     Comment: A DRINK A DAY  . Drug Use: No  . Sexually Active: Yes -- Female partner(s)    Birth Control/ Protection: Surgical   Other Topics Concern  . None   Social History  Narrative  . None      REVIEW OF SYSTEMS - PERTINENT POSITIVES ONLY: Review of Systems - General ROS: negative for - chills or fever Hematological and Lymphatic ROS: negative for - bleeding problems or blood clots Gastrointestinal ROS: negative for - abdominal pain or diarrhea  EXAM: Filed Vitals:   08/14/12 1550  BP: 120/72  Pulse: 76  Temp: 98.4 F (36.9 C)  Resp: 18    General appearance: alert and cooperative GI: normal findings: soft, non-tender CV: RRR Resp: CTA   Procedure: Anoscopy Surgeon: Maisie Fus Diagnosis: external hemorrhoids  Assistant: Christella Scheuermann After the risks and benefits were explained, verbal consent was obtained for above procedure  Anesthesia: none Findings: mild internal hemorrhoids, post skin tag     ASSESSMENT AND PLAN:  Mild external hemorrhoids- increase fiber intake.  Education materials provided.  Follow up PRN   Vanita Panda, MD Colon and Rectal Surgery / General Surgery [facility]      Visit  Diagnoses: No diagnosis found.  Primary Care Physician: Pcp Not In System

## 2012-10-19 ENCOUNTER — Other Ambulatory Visit: Payer: Self-pay

## 2012-11-15 ENCOUNTER — Encounter: Payer: Self-pay | Admitting: Gynecology

## 2012-11-19 ENCOUNTER — Encounter: Payer: Self-pay | Admitting: *Deleted

## 2012-12-26 ENCOUNTER — Encounter: Payer: Self-pay | Admitting: Gynecology

## 2012-12-26 ENCOUNTER — Ambulatory Visit (INDEPENDENT_AMBULATORY_CARE_PROVIDER_SITE_OTHER): Payer: BC Managed Care – PPO | Admitting: Gynecology

## 2012-12-26 VITALS — BP 124/78

## 2012-12-26 DIAGNOSIS — N898 Other specified noninflammatory disorders of vagina: Secondary | ICD-10-CM

## 2012-12-26 DIAGNOSIS — R300 Dysuria: Secondary | ICD-10-CM

## 2012-12-26 DIAGNOSIS — N899 Noninflammatory disorder of vagina, unspecified: Secondary | ICD-10-CM

## 2012-12-26 LAB — WET PREP FOR TRICH, YEAST, CLUE
Clue Cells Wet Prep HPF POC: NONE SEEN
WBC, Wet Prep HPF POC: NONE SEEN

## 2012-12-26 LAB — URINALYSIS W MICROSCOPIC + REFLEX CULTURE
Crystals: NONE SEEN
Leukocytes, UA: NEGATIVE
Nitrite: NEGATIVE
Specific Gravity, Urine: 1.02 (ref 1.005–1.030)
Urobilinogen, UA: 0.2 mg/dL (ref 0.0–1.0)
pH: 7 (ref 5.0–8.0)

## 2012-12-26 MED ORDER — FLUCONAZOLE 100 MG PO TABS: ORAL_TABLET | ORAL | Status: DC

## 2012-12-26 NOTE — Progress Notes (Signed)
Patient presented to the office today complaining of vaginal irritation and swelling. Patient has a new sexual partner past several months. She has use condoms as well. She feels irritated when voiding but no true dysuria. Patient has had past history of recurrent yeast infection.  Exam: Bartholin urethra Skene glands within normal limits Vagina erythematous with slight white discharge Vaginal cuff: Intact Bimanual exam not done Rectal exam not done  Urinalysis rare bacteria  Wet prep moderate yeast  Assessment/plan: Vulvovaginitis will be treated with Diflucan 100 mg one by mouth today. She will be prescribed 100 mg to take daily weekly for the next 3 months because of her past history of recurrent moniliasis. GC and Chlamydia culture result pending at time of this dictation.

## 2012-12-26 NOTE — Patient Instructions (Addendum)
Vaginitis  Vaginitis is an infection. It causes soreness, swelling, and redness (inflammation) of the vagina. Many of these infections are sexually transmitted diseases (STDs). Having unprotected sex can cause further problems and complications such as:   Chronic pelvic pain.   Infertility.   Unwanted pregnancy.   Abortion.   Tubal pregnancy.   Infection passed on to the newborn.   Cancer.  CAUSES    Monilia. This is a yeast or fungus infection, not an STD.   Bacterial vaginosis. The normal balance of bacteria in the vagina is disrupted and is replaced by an overgrowth of certain bacteria.   Gonorrhea, chlamydia. These are bacterial infections that are STDs.   Vaginal sponges, diaphragms, and intrauterine devices.   Trichomoniasis. This is a STD infection caused by a parasite.   Viruses like herpes and human papillomavirus. Both are STDs.   Pregnancy.   Immunosuppression. This occurs with certain conditions such as HIV infection or cancer.   Using bubble bath.   Taking certain antibiotic medicines.   Sporadic recurrence can occur if you become sick.   Diabetes.   Steroids.   Allergic reaction. If you have an allergy to:   Douches.   Soaps.   Spermicides.   Condoms.   Scented tampons or vaginal sprays.  SYMPTOMS    Abnormal vaginal discharge.   Itching of the vagina.   Pain in the vagina.   Swelling of the vagina.  In some cases, there are no symptoms.  TREATMENT   Treatment will vary depending on the type of infection.   Bacteria or trichomonas are usually treated with oral antibiotics and sometimes vaginal cream or suppositories.   Monilia vaginitis is usually treated with vaginal creams, suppositories, or oral antifungal pills.   Viral vaginitis has no cure. However, the symptoms of herpes (a viral vaginitis) can be treated to relieve the discomfort. Human papillomavirus has no symptoms. However, there are treatments for the diseases caused by human papillomavirus.   With allergic  vaginitis, you need to stop using the product that is causing the problem. Vaginal creams can be used to treat the symptoms.   When treating an STD, the sex partner should also be treated.  HOME CARE INSTRUCTIONS    Take all the medicines as directed by your caregiver.   Do not use scented tampons, soaps, or vaginal sprays.   Do not douche.   Tell your sex partner if you have a vaginal infection or an STD.   Do not have sexual intercourse until you have treated the vaginitis.   Practice safe sex by using condoms.  SEEK MEDICAL CARE IF:    You have abdominal pain.   Your symptoms get worse during treatment.  Document Released: 06/18/2007 Document Revised: 11/13/2011 Document Reviewed: 02/11/2009  ExitCare Patient Information 2013 [employer].

## 2012-12-27 LAB — GC/CHLAMYDIA PROBE AMP
CT Probe RNA: NEGATIVE
GC Probe RNA: NEGATIVE

## 2013-03-19 ENCOUNTER — Encounter: Payer: Self-pay | Admitting: Gynecology

## 2013-03-22 ENCOUNTER — Other Ambulatory Visit: Payer: Self-pay | Admitting: Gynecology

## 2013-03-24 NOTE — Telephone Encounter (Signed)
Dr. Glenetta Hew, Patient's chart is no longer on site for me to be able to look back at old operative report. Would this be something routinely done--to remove clip from fallopian tube from prior sterilization at time of hysterectomy?

## 2013-05-29 ENCOUNTER — Ambulatory Visit (INDEPENDENT_AMBULATORY_CARE_PROVIDER_SITE_OTHER): Payer: BC Managed Care – PPO | Admitting: Gynecology

## 2013-05-29 ENCOUNTER — Encounter: Payer: Self-pay | Admitting: Gynecology

## 2013-05-29 VITALS — BP 130/90 | Ht 62.0 in | Wt 143.0 lb

## 2013-05-29 DIAGNOSIS — Z79890 Hormone replacement therapy (postmenopausal): Secondary | ICD-10-CM

## 2013-05-29 DIAGNOSIS — Z113 Encounter for screening for infections with a predominantly sexual mode of transmission: Secondary | ICD-10-CM

## 2013-05-29 DIAGNOSIS — N898 Other specified noninflammatory disorders of vagina: Secondary | ICD-10-CM

## 2013-05-29 DIAGNOSIS — A499 Bacterial infection, unspecified: Secondary | ICD-10-CM

## 2013-05-29 DIAGNOSIS — N760 Acute vaginitis: Secondary | ICD-10-CM

## 2013-05-29 DIAGNOSIS — Z01419 Encounter for gynecological examination (general) (routine) without abnormal findings: Secondary | ICD-10-CM

## 2013-05-29 DIAGNOSIS — B9689 Other specified bacterial agents as the cause of diseases classified elsewhere: Secondary | ICD-10-CM

## 2013-05-29 LAB — WET PREP FOR TRICH, YEAST, CLUE
Trich, Wet Prep: NONE SEEN
WBC, Wet Prep HPF POC: NONE SEEN
Yeast Wet Prep HPF POC: NONE SEEN

## 2013-05-29 LAB — COMPREHENSIVE METABOLIC PANEL WITH GFR
ALT: 14 U/L (ref 0–35)
AST: 19 U/L (ref 0–37)
Albumin: 4.3 g/dL (ref 3.5–5.2)
Alkaline Phosphatase: 38 U/L — ABNORMAL LOW (ref 39–117)
BUN: 12 mg/dL (ref 6–23)
CO2: 26 meq/L (ref 19–32)
Calcium: 9.7 mg/dL (ref 8.4–10.5)
Chloride: 103 meq/L (ref 96–112)
Creat: 0.56 mg/dL (ref 0.50–1.10)
Glucose, Bld: 91 mg/dL (ref 70–99)
Potassium: 4.2 meq/L (ref 3.5–5.3)
Sodium: 136 meq/L (ref 135–145)
Total Bilirubin: 0.3 mg/dL (ref 0.3–1.2)
Total Protein: 7.1 g/dL (ref 6.0–8.3)

## 2013-05-29 LAB — CBC WITH DIFFERENTIAL/PLATELET
Basophils Relative: 0 % (ref 0–1)
MCHC: 34.1 g/dL (ref 30.0–36.0)
MCV: 91.7 fL (ref 78.0–100.0)
Monocytes Absolute: 0.5 10*3/uL (ref 0.1–1.0)
Monocytes Relative: 9 % (ref 3–12)
Neutro Abs: 3.4 10*3/uL (ref 1.7–7.7)
RBC: 4.32 MIL/uL (ref 3.87–5.11)
WBC: 5.7 10*3/uL (ref 4.0–10.5)

## 2013-05-29 LAB — CHOLESTEROL, TOTAL: Cholesterol: 224 mg/dL — ABNORMAL HIGH (ref 0–200)

## 2013-05-29 MED ORDER — ESTRADIOL 0.52 MG/0.87 GM (0.06%) TD GEL: 1.0000 "application " | Freq: Every day | TRANSDERMAL | Status: DC

## 2013-05-29 MED ORDER — METRONIDAZOLE 500 MG PO TABS: 500.0000 mg | ORAL_TABLET | Freq: Two times a day (BID) | ORAL | Status: DC

## 2013-05-29 NOTE — Progress Notes (Signed)
Dawn Mcmahon June 15, 1966 [id number]   History:    47 y.o.  for annual gyn exam with no complaints except a slight discharge but wanted to have an STD screen. Patient also has been followed by an endocrinologist Dr. 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 She is no longer any thyroid medication scheduled to see endocrinologist at the end of the year. Patient has a history of LAVH BSO in 2008. She has a mother who had colon cancer at the age of 45 . Patient had a colonoscopy in 2012 and benign polyp was detected. Her sister had breast cancer at the age of 29 as well as an aunt who also had breast cancer. Patient's last mammogram was normal October 2013 but dense. The patient was tested for the BRCA1/BRCA2 mutation and none were detected in October of 2011. In 2012 because of her vasomotor symptoms patient was tested and found to have an elevated FSH and was started on Elestrin 0.06% transdermal gel and has done well. Patient declined flu vaccine.  Past medical history,surgical history, family history and social history were all reviewed and documented in the EPIC chart.  Gynecologic History No LMP recorded. Patient has had a hysterectomy. Contraception: status post hysterectomy Last Pap: 2012. Results were: normal Last mammogram: 2013. Results were: normal but dense  Obstetric History OB History  Gravida Para Term Preterm AB SAB TAB Ectopic Multiple Living  2 1 1  1 1    1     # Outcome Date GA Lbr Len/2nd Weight Sex Delivery Anes PTL Lv  2 SAB           1 TRM     F CS  N Y       ROS: A ROS was performed and pertinent positives and negatives are included in the history.  GENERAL: No fevers or chills. HEENT: No change in vision, no earache, sore throat or sinus congestion. NECK: No pain or stiffness. CARDIOVASCULAR: No chest pain or pressure. No palpitations. PULMONARY: No shortness of breath, cough or wheeze. GASTROINTESTINAL: No  abdominal pain, nausea, vomiting or diarrhea, melena or bright red blood per rectum. GENITOURINARY: No urinary frequency, urgency, hesitancy or dysuria. MUSCULOSKELETAL: No joint or muscle pain, no back pain, no recent trauma. DERMATOLOGIC: No rash, no itching, no lesions. ENDOCRINE: No polyuria, polydipsia, no heat or cold intolerance. No recent change in weight. HEMATOLOGICAL: No anemia or easy bruising or bleeding. NEUROLOGIC: No headache, seizures, numbness, tingling or weakness. PSYCHIATRIC: No depression, no loss of interest in normal activity or change in sleep pattern.     Exam: chaperone present  BP 130/90  Ht 5\' 2"  (1.575 m)  Wt 143 lb (64.864 kg)  BMI 26.15 kg/m2  Body mass index is 26.15 kg/(m^2).  General appearance : Well developed well nourished female. No acute distress HEENT: Neck supple, trachea midline, no carotid bruits, no thyroidmegaly Lungs: Clear to auscultation, no rhonchi or wheezes, or rib retractions  Heart: Regular rate and rhythm, no murmurs or gallops Breast:Examined in sitting and supine position were symmetrical in appearance, no palpable masses or tenderness,  no skin retraction, no nipple inversion, no nipple discharge, no skin discoloration, no axillary or supraclavicular lymphadenopathy Abdomen: no palpable masses or tenderness, no rebound or guarding Extremities: no edema or skin discoloration or tenderness  Pelvic:  Bartholin, Urethra, Skene Glands: Within normal limits             Vagina:  No gross lesions or discharge, Clear fascia odor discharge  Cervix: absent  Uterus Absent  Adnexa  Without masses or tenderness  Anus and perineum  normal   Rectovaginal  normal sphincter tone without palpated masses or tenderness             Hemoccult course provided   Wet prep:Pos Amine, too numerous to count clue cells, too numerous to count bacteria  Assessment/Plan:  48 y.o. female for annual exam with clinical evidence of bacterial vaginosis. Patient  will be placed on Flagyl 500 mg one by mouth twice a day for 5 days. GC and chlamydia culture pending at time of this dictation. Prescription refill for transdermal estrogen was provided. Patient was reminded to do her monthly self breast examination. We talked about the importance of calcium and vitamin D and regular exercise for osteoporosis prevention. She needs to schedule her mammogram for next year and request for a 3-D because of her dense breasts. To complete the STD screen we'll check HIV, hepatitis B, hepatitis C and RPR. The rest of her labs are as follows: CBC, screening cholesterol, comprehensive metabolic panel, and urinalysis. Pap smear not done today and adherence to the new guidelines. Patient was reminded to submit to the office the Hemoccult cards for testing.   Ok Edwards MD, 4:51 PM 05/29/2013

## 2013-05-30 ENCOUNTER — Other Ambulatory Visit: Payer: Self-pay | Admitting: *Deleted

## 2013-05-30 DIAGNOSIS — E7800 Pure hypercholesterolemia, unspecified: Secondary | ICD-10-CM

## 2013-05-30 LAB — URINALYSIS W MICROSCOPIC + REFLEX CULTURE
Leukocytes, UA: NEGATIVE
Nitrite: NEGATIVE
Protein, ur: NEGATIVE mg/dL
Squamous Epithelial / LPF: NONE SEEN
Urobilinogen, UA: 0.2 mg/dL (ref 0.0–1.0)

## 2013-05-30 LAB — HIV ANTIBODY (ROUTINE TESTING W REFLEX): HIV: NONREACTIVE

## 2013-05-30 LAB — GC/CHLAMYDIA PROBE AMP: CT Probe RNA: NEGATIVE

## 2013-05-30 LAB — HEPATITIS B SURFACE ANTIGEN: Hepatitis B Surface Ag: NEGATIVE

## 2013-07-04 ENCOUNTER — Encounter: Payer: Self-pay | Admitting: Gynecology

## 2013-07-10 ENCOUNTER — Other Ambulatory Visit: Payer: Self-pay

## 2013-08-20 ENCOUNTER — Other Ambulatory Visit: Payer: Self-pay | Admitting: Endocrinology

## 2013-08-20 DIAGNOSIS — E049 Nontoxic goiter, unspecified: Secondary | ICD-10-CM

## 2013-08-25 ENCOUNTER — Ambulatory Visit
Admission: RE | Admit: 2013-08-25 | Discharge: 2013-08-25 | Disposition: A | Discharge status: Home / Self Care | Payer: BC Managed Care – PPO | Source: Ambulatory Visit | Attending: Endocrinology | Admitting: Endocrinology

## 2013-08-25 DIAGNOSIS — E049 Nontoxic goiter, unspecified: Secondary | ICD-10-CM

## 2013-10-07 ENCOUNTER — Ambulatory Visit (INDEPENDENT_AMBULATORY_CARE_PROVIDER_SITE_OTHER): Payer: BC Managed Care – PPO | Admitting: Women's Health

## 2013-10-07 ENCOUNTER — Encounter: Payer: Self-pay | Admitting: Women's Health

## 2013-10-07 DIAGNOSIS — N898 Other specified noninflammatory disorders of vagina: Secondary | ICD-10-CM

## 2013-10-07 DIAGNOSIS — R300 Dysuria: Secondary | ICD-10-CM

## 2013-10-07 DIAGNOSIS — B373 Candidiasis of vulva and vagina: Secondary | ICD-10-CM

## 2013-10-07 DIAGNOSIS — B3731 Acute candidiasis of vulva and vagina: Secondary | ICD-10-CM

## 2013-10-07 LAB — URINALYSIS W MICROSCOPIC + REFLEX CULTURE
Bilirubin Urine: NEGATIVE
Casts: NONE SEEN
Crystals: NONE SEEN
Glucose, UA: NEGATIVE mg/dL
Ketones, ur: NEGATIVE mg/dL
LEUKOCYTES UA: NEGATIVE
NITRITE: NEGATIVE
PROTEIN: NEGATIVE mg/dL
Specific Gravity, Urine: 1.02 (ref 1.005–1.030)
Urobilinogen, UA: 0.2 mg/dL (ref 0.0–1.0)
WBC, UA: NONE SEEN WBC/hpf (ref <3)
pH: 7.5 (ref 5.0–8.0)

## 2013-10-07 LAB — WET PREP FOR TRICH, YEAST, CLUE
CLUE CELLS WET PREP: NONE SEEN
TRICH WET PREP: NONE SEEN

## 2013-10-07 MED ORDER — FLUCONAZOLE 100 MG PO TABS: ORAL_TABLET | ORAL | Status: DC

## 2013-10-07 NOTE — Patient Instructions (Signed)
Monilial Vaginitis  Vaginitis in a soreness, swelling and redness (inflammation) of the vagina and vulva. Monilial vaginitis is not a sexually transmitted infection.  CAUSES   Yeast vaginitis is caused by yeast (candida) that is normally found in your vagina. With a yeast infection, the candida has overgrown in number to a point that upsets the chemical balance.  SYMPTOMS   · White, thick vaginal discharge.  · Swelling, itching, redness and irritation of the vagina and possibly the lips of the vagina (vulva).  · Burning or painful urination.  · Painful intercourse.  DIAGNOSIS   Things that may contribute to monilial vaginitis are:  · Postmenopausal and virginal states.  · Pregnancy.  · Infections.  · Being tired, sick or stressed, especially if you had monilial vaginitis in the past.  · Diabetes. Good control will help lower the chance.  · Birth control pills.  · Tight fitting garments.  · Using bubble bath, feminine sprays, douches or deodorant tampons.  · Taking certain medications that kill germs (antibiotics).  · Sporadic recurrence can occur if you become ill.  TREATMENT   Your caregiver will give you medication.  · There are several kinds of anti monilial vaginal creams and suppositories specific for monilial vaginitis. For recurrent yeast infections, use a suppository or cream in the vagina 2 times a week, or as directed.  · Anti-monilial or steroid cream for the itching or irritation of the vulva may also be used. Get your caregiver's permission.  · Painting the vagina with methylene blue solution may help if the monilial cream does not work.  · Eating yogurt may help prevent monilial vaginitis.  HOME CARE INSTRUCTIONS   · Finish all medication as prescribed.  · Do not have sex until treatment is completed or after your caregiver tells you it is okay.  · Take warm sitz baths.  · Do not douche.  · Do not use tampons, especially scented ones.  · Wear cotton underwear.  · Avoid tight pants and panty  hose.  · Tell your sexual partner that you have a yeast infection. They should go to their caregiver if they have symptoms such as mild rash or itching.  · Your sexual partner should be treated as well if your infection is difficult to eliminate.  · Practice safer sex. Use condoms.  · Some vaginal medications cause latex condoms to fail. Vaginal medications that harm condoms are:  · Cleocin cream.  · Butoconazole (Femstat®).  · Terconazole (Terazol®) vaginal suppository.  · Miconazole (Monistat®) (may be purchased over the counter).  SEEK MEDICAL CARE IF:   · You have a temperature by mouth above 102° F (38.9° C).  · The infection is getting worse after 2 days of treatment.  · The infection is not getting better after 3 days of treatment.  · You develop blisters in or around your vagina.  · You develop vaginal bleeding, and it is not your menstrual period.  · You have pain when you urinate.  · You develop intestinal problems.  · You have pain with sexual intercourse.  Document Released: 05/31/2005 Document Revised: 11/13/2011 Document Reviewed: 02/12/2009  ExitCare® Patient Information ©2014 [employer].

## 2013-10-07 NOTE — Progress Notes (Signed)
Patient ID: Kennieth FrancoisKaren E Mcnorton, female   DOB: April 15, 1966, 48 y.o.   MRN: [id number] Presents with complaint of vaginal discharge with irritation and itching, urinary frequency with slight burning. Has used over-the-counter Monistat with minimal relief. LAVH with RSO. On elestrin. Denies abdominal pain, fever. History of recurrent yeast.  Exam: Appears well. External genitalia extremely erythematous, speculum exam scant white discharge vaginal wall slightly erythematous, wet prep positive for yeast. Bimanual no tenderness noted. UA: Trace blood, 0 - 2 RBCs, rare bacteria.  Yeast vaginitis  Plan: Diflucan 100 mg 2 tablets today and then 1 tablet weekly as needed per Dr. Lily PeerFernandez. Instructed to call if no relief.

## 2013-10-11 ENCOUNTER — Other Ambulatory Visit: Payer: Self-pay | Admitting: Gynecology

## 2013-10-11 ENCOUNTER — Encounter: Payer: Self-pay | Admitting: Gynecology

## 2013-10-13 ENCOUNTER — Telehealth: Payer: Self-pay

## 2013-10-13 ENCOUNTER — Other Ambulatory Visit: Payer: Self-pay

## 2013-10-13 ENCOUNTER — Other Ambulatory Visit: Payer: Self-pay | Admitting: Gynecology

## 2013-10-13 MED ORDER — TERCONAZOLE 0.4 % VA CREA: 1.0000 | TOPICAL_CREAM | Freq: Every day | VAGINAL | Status: DC

## 2013-10-13 NOTE — Telephone Encounter (Signed)
Emailed patient with Dr. Glenetta HewJF recommendation and Rx sent.

## 2013-10-13 NOTE — Telephone Encounter (Signed)
Terazol-7 apply qhs for 1 week if no improvement will need OV

## 2013-10-13 NOTE — Telephone Encounter (Signed)
Patient saw Dawn Mcmahon on 10/07/13 for diagnosis of yeast infection and Diflucan was prescribed.  Patient emailed today "Dawn Mcmahon told me to contact the office if the medicine was not helping. I am still having some of the same symptoms. The redness is getting better but am still feeling swollen. I took the two pills the first day. Usually after this many days the symptoms are already gone."  What to recommend?

## 2014-03-11 ENCOUNTER — Other Ambulatory Visit: Payer: Self-pay | Admitting: Gynecology

## 2014-06-02 ENCOUNTER — Encounter: Payer: BC Managed Care – PPO | Admitting: Gynecology

## 2014-06-02 ENCOUNTER — Ambulatory Visit (INDEPENDENT_AMBULATORY_CARE_PROVIDER_SITE_OTHER): Payer: BC Managed Care – PPO | Admitting: Gynecology

## 2014-06-02 ENCOUNTER — Encounter: Payer: Self-pay | Admitting: Gynecology

## 2014-06-02 VITALS — BP 120/78 | Ht 62.0 in | Wt 143.0 lb

## 2014-06-02 DIAGNOSIS — Z87898 Personal history of other specified conditions: Secondary | ICD-10-CM

## 2014-06-02 DIAGNOSIS — Z79890 Hormone replacement therapy (postmenopausal): Secondary | ICD-10-CM

## 2014-06-02 DIAGNOSIS — Z8669 Personal history of other diseases of the nervous system and sense organs: Secondary | ICD-10-CM

## 2014-06-02 DIAGNOSIS — E8940 Asymptomatic postprocedural ovarian failure: Secondary | ICD-10-CM

## 2014-06-02 DIAGNOSIS — Z01419 Encounter for gynecological examination (general) (routine) without abnormal findings: Secondary | ICD-10-CM

## 2014-06-02 DIAGNOSIS — E8941 Symptomatic postprocedural ovarian failure: Secondary | ICD-10-CM

## 2014-06-02 DIAGNOSIS — Z113 Encounter for screening for infections with a predominantly sexual mode of transmission: Secondary | ICD-10-CM

## 2014-06-02 MED ORDER — VARENICLINE TARTRATE 0.5 MG PO TABS: ORAL_TABLET | ORAL | Status: DC

## 2014-06-02 MED ORDER — MECLIZINE HCL 12.5 MG PO TABS: 12.5000 mg | ORAL_TABLET | Freq: Three times a day (TID) | ORAL | Status: DC | PRN

## 2014-06-02 MED ORDER — ESTRADIOL 0.52 MG/0.87 GM (0.06%) TD GEL: 1.0000 "application " | Freq: Every day | TRANSDERMAL | Status: DC

## 2014-06-02 NOTE — Progress Notes (Signed)
Dawn Mcmahon 1965-09-22 [id number]   History:    48 y.o.  for annual gyn exam with no complaints today but wishing to have an STD screen since she has a new partner.Patient also has been followed by an endocrinologist Dr. 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 She is no longer any thyroid medication scheduled to see endocrinologist at the end of the year. Patient has a history of LAVH BSO in 2008. She has a mother who had colon cancer at the age of 64 . Patient had a colonoscopy in 2012 and benign polyp was detected. Her sister had breast cancer at the age of 21 as well as an aunt who also had breast cancer. Patient's last mammogram was normal October 2013 but dense.  The patient was tested for the BRCA1/BRCA2 mutation and none were detected in October of 2011. In 2012 because of her vasomotor symptoms patient was tested and found to have an elevated [place] and was started on Elestrin 0.06% transdermal gel and has done well. Patient declined flu vaccine.   Past medical history,surgical history, family history and social history were all reviewed and documented in the EPIC chart.  Gynecologic History No LMP recorded. Patient has had a hysterectomy. Contraception: status post hysterectomy Last Pap: 2012. Results were: normal Last mammogram: 2014. Results were: normal  Obstetric History OB History  Gravida Para Term Preterm AB SAB TAB Ectopic Multiple Living  '2 1 1  1 1    1    ' # Outcome Date GA Lbr Len/2nd Weight Sex Delivery Anes PTL Lv  2 SAB           1 TRM     F CS  N Y       ROS: A ROS was performed and pertinent positives and negatives are included in the history.  GENERAL: No fevers or chills. HEENT: No change in vision, no earache, sore throat or sinus congestion. NECK: No pain or stiffness. CARDIOVASCULAR: No chest pain or pressure. No palpitations. PULMONARY: No shortness of breath, cough or wheeze. GASTROINTESTINAL: No  abdominal pain, nausea, vomiting or diarrhea, melena or bright red blood per rectum. GENITOURINARY: No urinary frequency, urgency, hesitancy or dysuria. MUSCULOSKELETAL: No joint or muscle pain, no back pain, no recent trauma. DERMATOLOGIC: No rash, no itching, no lesions. ENDOCRINE: No polyuria, polydipsia, no heat or cold intolerance. No recent change in weight. HEMATOLOGICAL: No anemia or easy bruising or bleeding. NEUROLOGIC: No headache, seizures, numbness, tingling or weakness. PSYCHIATRIC: No depression, no loss of interest in normal activity or change in sleep pattern.     Exam: chaperone present  BP 120/78  Ht '5\' 2"'  (1.575 m)  Wt 143 lb (64.864 kg)  BMI 26.15 kg/m2  Body mass index is 26.15 kg/(m^2).  General appearance : Well developed well nourished female. No acute distress HEENT: Neck supple, trachea midline, no carotid bruits, no thyroidmegaly Lungs: Clear to auscultation, no rhonchi or wheezes, or rib retractions  Heart: Regular rate and rhythm, no murmurs or gallops Breast:Examined in sitting and supine position were symmetrical in appearance, no palpable masses or tenderness,  no skin retraction, no nipple inversion, no nipple discharge, no skin discoloration, no axillary or supraclavicular lymphadenopathy Abdomen: no palpable masses or tenderness, no rebound or guarding Extremities: no edema or skin discoloration or tenderness  Pelvic:  Bartholin, Urethra, Skene Glands: Within normal limits  Vagina: No gross lesions or discharge  Cervix: Absent  Uterus  absent  Adnexa  Without masses or tenderness  Anus and perineum  normal   Rectovaginal  normal sphincter tone without palpated masses or tenderness             Hemoccult cards were provided    Assessment/Plan:  48 y.o. female for annual exam was reminded to schedule a mammogram next month. STD screen done per patient's request consisting of GC and Chlamydia culture, HIV, RPR, hepatitis B and C. The  following labs were also ordered: CBC, screen cholesterol, comprehensive metabolic panel, TSH, and urinalysis. Pap smear not done in accordance to the new guidelines. Patient began smoking again and prescription for Chantix was provided. Risks benefits and pros and cons discussed. Patient also wanted a prescription refill for her meclizine as a result of her vertigo that was diagnosed by her neurologist. Patient will schedule her bone density study since she has surgical menopause. We discussed importance of calcium vitamin D and regular exercise for osteoporosis prevention.   Note: This dictation was prepared with  Dragon/digital dictation along withSmart phrase technology. Any transcriptional errors that result from this process are unintentional.   Terrance Mass MD, 3:26 PM 06/02/2014

## 2014-06-02 NOTE — Patient Instructions (Addendum)
Varenicline oral tablets What is this medicine? VARENICLINE (var EN i kleen) is used to help people quit smoking. It can reduce the symptoms caused by stopping smoking. It is used with a patient support program recommended by your physician. This medicine may be used for other purposes; ask your health care provider or pharmacist if you have questions. COMMON BRAND NAME(S): Chantix What should I tell my health care provider before I take this medicine? They need to know if you have any of these conditions: -bipolar disorder, depression, schizophrenia or other mental illness -heart disease -if you often drink alcohol -kidney disease -peripheral vascular disease -seizures -stroke -suicidal thoughts, plans, or attempt; a previous suicide attempt by you or a family member -an unusual or allergic reaction to varenicline, other medicines, foods, dyes, or preservatives -pregnant or trying to get pregnant -breast-feeding How should I use this medicine? You should set a date to stop smoking and tell your doctor. Start this medicine one week before the quit date. You can also start taking this medicine before you choose a quit date, and then pick a quit date that is between 8 and 35 days of treatment with this medicine. Stick to your plan; ask about support groups or other ways to help you remain a 'quitter'. Take this medicine by mouth after eating. Take with a full glass of water. Follow the directions on the prescription label. Take your doses at regular intervals. Do not take your medicine more often than directed. A special MedGuide will be given to you by the pharmacist with each prescription and refill. Be sure to read this information carefully each time. Talk to your pediatrician regarding the use of this medicine in children. This medicine is not approved for use in children. Overdosage: If you think you have taken too much of this medicine contact a poison control center or emergency room at  once. NOTE: This medicine is only for you. Do not share this medicine with others. What if I miss a dose? If you miss a dose, take it as soon as you can. If it is almost time for your next dose, take only that dose. Do not take double or extra doses. What may interact with this medicine? -alcohol or any product that contains alcohol -insulin -other stop smoking aids -theophylline -warfarin This list may not describe all possible interactions. Give your health care provider a list of all the medicines, herbs, non-prescription drugs, or dietary supplements you use. Also tell them if you smoke, drink alcohol, or use illegal drugs. Some items may interact with your medicine. What should I watch for while using this medicine? Visit your doctor or health care professional for regular check ups. Ask for ongoing advice and encouragement from your doctor or healthcare professional, friends, and family to help you quit. If you smoke while on this medication, quit again Your mouth may get dry. Chewing sugarless gum or sucking hard candy, and drinking plenty of water may help. Contact your doctor if the problem does not go away or is severe. You may get drowsy or dizzy. Do not drive, use machinery, or do anything that needs mental alertness until you know how this medicine affects you. Do not stand or sit up quickly, especially if you are an older patient. This reduces the risk of dizzy or fainting spells. The use of this medicine may increase the chance of suicidal thoughts or actions. Pay special attention to how you are responding while on this medicine. Any worsening of   mood, or thoughts of suicide or dying should be reported to your health care professional right away. What side effects may I notice from receiving this medicine? Side effects that you should report to your doctor or health care professional as soon as possible: -allergic reactions like skin rash, itching or hives, swelling of the face,  lips, tongue, or throat -breathing problems -changes in vision -chest pain or chest tightness -confusion, trouble speaking or understanding -fast, irregular heartbeat -feeling faint or lightheaded, falls -fever -pain in legs when walking -problems with balance, talking, walking -redness, blistering, peeling or loosening of the skin, including inside the mouth -ringing in ears -seizures -sudden numbness or weakness of the face, arm or leg -suicidal thoughts or other mood changes -trouble passing urine or change in the amount of urine -unusual bleeding or bruising -unusually weak or tired Side effects that usually do not require medical attention (report to your doctor or health care professional if they continue or are bothersome): -constipation -headache -nausea, vomiting -strange dreams -stomach gas -trouble sleeping This list may not describe all possible side effects. Call your doctor for medical advice about side effects. You may report side effects to FDA at 1-800-FDA-1088. Where should I keep my medicine? Keep out of the reach of children. Store at room temperature between 15 and 30 degrees C (59 and 86 degrees F). Throw away any unused medicine after the expiration date. NOTE: This sheet is a summary. It may not cover all possible information. If you have questions about this medicine, talk to your doctor, pharmacist, or health care provider.  2015, Elsevier/Gold Standard. (2013-06-02 13:37:47) Influenza Virus Vaccine injection (Fluarix) What is this medicine? INFLUENZA VIRUS VACCINE (in floo EN zuh VAHY ruhs vak SEEN) helps to reduce the risk of getting influenza also known as the flu. This medicine may be used for other purposes; ask your health care provider or pharmacist if you have questions. COMMON BRAND NAME(S): Fluarix, Fluzone What should I tell my health care provider before I take this medicine? They need to know if you have any of these conditions: -bleeding  disorder like hemophilia -fever or infection -Guillain-Barre syndrome or other neurological problems -immune system problems -infection with the human immunodeficiency virus (HIV) or AIDS -low blood platelet counts -multiple sclerosis -an unusual or allergic reaction to influenza virus vaccine, eggs, chicken proteins, latex, gentamicin, other medicines, foods, dyes or preservatives -pregnant or trying to get pregnant -breast-feeding How should I use this medicine? This vaccine is for injection into a muscle. It is given by a health care professional. A copy of Vaccine Information Statements will be given before each vaccination. Read this sheet carefully each time. The sheet may change frequently. Talk to your pediatrician regarding the use of this medicine in children. Special care may be needed. Overdosage: If you think you have taken too much of this medicine contact a poison control center or emergency room at once. NOTE: This medicine is only for you. Do not share this medicine with others. What if I miss a dose? This does not apply. What may interact with this medicine? -chemotherapy or radiation therapy -medicines that lower your immune system like etanercept, anakinra, infliximab, and adalimumab -medicines that treat or prevent blood clots like warfarin -phenytoin -steroid medicines like prednisone or cortisone -theophylline -vaccines This list may not describe all possible interactions. Give your health care provider a list of all the medicines, herbs, non-prescription drugs, or dietary supplements you use. Also tell them if you smoke, drink alcohol,  or use illegal drugs. Some items may interact with your medicine. What should I watch for while using this medicine? Report any side effects that do not go away within 3 days to your doctor or health care professional. Call your health care provider if any unusual symptoms occur within 6 weeks of receiving this vaccine. You may  still catch the flu, but the illness is not usually as bad. You cannot get the flu from the vaccine. The vaccine will not protect against colds or other illnesses that may cause fever. The vaccine is needed every year. What side effects may I notice from receiving this medicine? Side effects that you should report to your doctor or health care professional as soon as possible: -allergic reactions like skin rash, itching or hives, swelling of the face, lips, or tongue Side effects that usually do not require medical attention (report to your doctor or health care professional if they continue or are bothersome): -fever -headache -muscle aches and pains -pain, tenderness, redness, or swelling at site where injected -weak or tired This list may not describe all possible side effects. Call your doctor for medical advice about side effects. You may report side effects to FDA at 1-800-FDA-1088. Where should I keep my medicine? This vaccine is only given in a clinic, pharmacy, doctor's office, or other health care setting and will not be stored at home. NOTE: This sheet is a summary. It may not cover all possible information. If you have questions about this medicine, talk to your doctor, pharmacist, or health care provider.  2015, Elsevier/Gold Standard. (2008-03-18 09:30:40) Bone Densitometry Bone densitometry is a special X-ray that measures your bone density and can be used to help predict your risk of bone fractures. This test is used to determine bone mineral content and density to diagnose osteoporosis. Osteoporosis is the loss of bone that may cause the bone to become weak. Osteoporosis commonly occurs in women entering menopause. However, it may be found in men and in people with other diseases. PREPARATION FOR TEST No preparation necessary. WHO SHOULD BE TESTED?  All women older than 1865.  Postmenopausal women (50 to 165) with risk factors for osteoporosis.  People with a previous fracture  caused by normal activities.  People with a small body frame (less than 127 poundsor a body mass index [BMI] of less than 21).  People who have a parent with a hip fracture or history of osteoporosis.  People who smoke.  People who have rheumatoid arthritis.  Anyone who engages in excessive alcohol use (more than 3 drinks most days).  Women who experience early menopause. WHEN SHOULD YOU BE RETESTED? Current guidelines suggest that you should wait at least 2 years before doing a bone density test again if your first test was normal.Recent studies indicated that women with normal bone density may be able to wait a few years before needing to repeat a bone density test. You should discuss this with your caregiver.  NORMAL FINDINGS   Normal: less than standard deviation below normal (greater than -1).  Osteopenia: 1 to 2.5 standard deviations below normal (-1 to -2.5).  Osteoporosis: greater than 2.5 standard deviations below normal (less than -2.5). Test results are reported as a "T score" and a "Z score."The T score is a number that compares your bone density with the bone density of healthy, young women.The Z score is a number that compares your bone density with the scores of women who are the same age, gender, and race.  Ranges for normal findings may vary among different laboratories and hospitals. You should always check with your doctor after having lab work or other tests done to discuss the meaning of your test results and whether your values are considered within normal limits. MEANING OF TEST  Your caregiver will go over the test results with you and discuss the importance and meaning of your results, as well as treatment options and the need for additional tests if necessary. OBTAINING THE TEST RESULTS It is your responsibility to obtain your test results. Ask the lab or department performing the test when and how you will get your results. Document Released: 09/12/2004  Document Revised: 11/13/2011 Document Reviewed: 10/05/2010 [facility] Patient Information 2015 [place], [place]. This information is not intended to replace advice given to you by your health care provider. Make sure you discuss any questions you have with your health care provider.

## 2014-06-03 LAB — HEPATITIS B SURFACE ANTIGEN: Hepatitis B Surface Ag: NEGATIVE

## 2014-06-03 LAB — CBC WITH DIFFERENTIAL/PLATELET
BASOS ABS: 0.1 10*3/uL (ref 0.0–0.1)
Basophils Relative: 1 % (ref 0–1)
Eosinophils Absolute: 0.3 10*3/uL (ref 0.0–0.7)
Eosinophils Relative: 6 % — ABNORMAL HIGH (ref 0–5)
HCT: 36.5 % (ref 36.0–46.0)
Hemoglobin: 12.5 g/dL (ref 12.0–15.0)
Lymphocytes Relative: 36 % (ref 12–46)
Lymphs Abs: 1.9 10*3/uL (ref 0.7–4.0)
MCH: 31.4 pg (ref 26.0–34.0)
MCHC: 34.2 g/dL (ref 30.0–36.0)
MCV: 91.7 fL (ref 78.0–100.0)
Monocytes Absolute: 0.5 10*3/uL (ref 0.1–1.0)
Monocytes Relative: 10 % (ref 3–12)
NEUTROS ABS: 2.5 10*3/uL (ref 1.7–7.7)
NEUTROS PCT: 47 % (ref 43–77)
Platelets: 208 10*3/uL (ref 150–400)
RBC: 3.98 MIL/uL (ref 3.87–5.11)
RDW: 12.6 % (ref 11.5–15.5)
WBC: 5.4 10*3/uL (ref 4.0–10.5)

## 2014-06-03 LAB — URINALYSIS W MICROSCOPIC + REFLEX CULTURE
BILIRUBIN URINE: NEGATIVE
Bacteria, UA: NONE SEEN
Casts: NONE SEEN
Crystals: NONE SEEN
GLUCOSE, UA: NEGATIVE mg/dL
Hgb urine dipstick: NEGATIVE
Ketones, ur: NEGATIVE mg/dL
Leukocytes, UA: NEGATIVE
Nitrite: NEGATIVE
Protein, ur: NEGATIVE mg/dL
Specific Gravity, Urine: 1.021 (ref 1.005–1.030)
Squamous Epithelial / LPF: NONE SEEN
Urobilinogen, UA: 0.2 mg/dL (ref 0.0–1.0)
pH: 7 (ref 5.0–8.0)

## 2014-06-03 LAB — COMPREHENSIVE METABOLIC PANEL
ALBUMIN: 4 g/dL (ref 3.5–5.2)
ALT: 14 U/L (ref 0–35)
AST: 18 U/L (ref 0–37)
Alkaline Phosphatase: 38 U/L — ABNORMAL LOW (ref 39–117)
BUN: 14 mg/dL (ref 6–23)
CALCIUM: 8.8 mg/dL (ref 8.4–10.5)
CO2: 25 mEq/L (ref 19–32)
CREATININE: 0.96 mg/dL (ref 0.50–1.10)
Chloride: 104 mEq/L (ref 96–112)
Glucose, Bld: 91 mg/dL (ref 70–99)
Potassium: 4.2 mEq/L (ref 3.5–5.3)
SODIUM: 138 meq/L (ref 135–145)
Total Bilirubin: 0.3 mg/dL (ref 0.2–1.2)
Total Protein: 6.4 g/dL (ref 6.0–8.3)

## 2014-06-03 LAB — GC/CHLAMYDIA PROBE AMP
CT Probe RNA: NEGATIVE
GC Probe RNA: NEGATIVE

## 2014-06-03 LAB — RPR

## 2014-06-03 LAB — HEPATITIS C ANTIBODY: HCV AB: NEGATIVE

## 2014-06-03 LAB — TSH: TSH: 0.564 u[IU]/mL (ref 0.350–4.500)

## 2014-06-03 LAB — CHOLESTEROL, TOTAL: Cholesterol: 173 mg/dL (ref 0–200)

## 2014-06-03 LAB — HIV ANTIBODY (ROUTINE TESTING W REFLEX): HIV 1&2 Ab, 4th Generation: NONREACTIVE

## 2014-06-08 ENCOUNTER — Encounter: Payer: Self-pay | Admitting: Gynecology

## 2014-07-06 ENCOUNTER — Encounter: Payer: Self-pay | Admitting: Gynecology

## 2014-08-03 ENCOUNTER — Ambulatory Visit (INDEPENDENT_AMBULATORY_CARE_PROVIDER_SITE_OTHER): Payer: BC Managed Care – PPO

## 2014-08-03 ENCOUNTER — Other Ambulatory Visit: Payer: Self-pay | Admitting: Gynecology

## 2014-08-03 DIAGNOSIS — E8940 Asymptomatic postprocedural ovarian failure: Secondary | ICD-10-CM

## 2014-08-03 DIAGNOSIS — Z79890 Hormone replacement therapy (postmenopausal): Secondary | ICD-10-CM

## 2014-08-03 DIAGNOSIS — N958 Other specified menopausal and perimenopausal disorders: Secondary | ICD-10-CM

## 2014-08-03 DIAGNOSIS — Z13820 Encounter for screening for osteoporosis: Secondary | ICD-10-CM

## 2014-08-20 ENCOUNTER — Other Ambulatory Visit: Payer: Self-pay | Admitting: Endocrinology

## 2014-08-20 DIAGNOSIS — E049 Nontoxic goiter, unspecified: Secondary | ICD-10-CM

## 2014-08-25 ENCOUNTER — Ambulatory Visit
Admission: RE | Admit: 2014-08-25 | Discharge: 2014-08-25 | Disposition: A | Discharge status: Home / Self Care | Payer: BC Managed Care – PPO | Source: Ambulatory Visit | Attending: Endocrinology | Admitting: Endocrinology

## 2014-08-25 DIAGNOSIS — E049 Nontoxic goiter, unspecified: Secondary | ICD-10-CM

## 2014-09-17 ENCOUNTER — Encounter: Payer: Self-pay | Admitting: Gynecology

## 2014-09-18 ENCOUNTER — Other Ambulatory Visit: Payer: Self-pay | Admitting: Gynecology

## 2014-09-18 MED ORDER — ESTRADIOL 0.52 MG/0.87 GM (0.06%) TD GEL: 1.0000 "application " | Freq: Every day | TRANSDERMAL | Status: DC

## 2014-10-23 ENCOUNTER — Encounter: Payer: Self-pay | Admitting: Gynecology

## 2015-02-12 ENCOUNTER — Encounter: Payer: Self-pay | Admitting: Women's Health

## 2015-02-12 ENCOUNTER — Ambulatory Visit (INDEPENDENT_AMBULATORY_CARE_PROVIDER_SITE_OTHER): Payer: BLUE CROSS/BLUE SHIELD | Admitting: Women's Health

## 2015-02-12 DIAGNOSIS — B3731 Acute candidiasis of vulva and vagina: Secondary | ICD-10-CM

## 2015-02-12 DIAGNOSIS — Z113 Encounter for screening for infections with a predominantly sexual mode of transmission: Secondary | ICD-10-CM | POA: Diagnosis not present

## 2015-02-12 DIAGNOSIS — B373 Candidiasis of vulva and vagina: Secondary | ICD-10-CM

## 2015-02-12 LAB — WET PREP FOR TRICH, YEAST, CLUE
CLUE CELLS WET PREP: NONE SEEN
Trich, Wet Prep: NONE SEEN
YEAST WET PREP: NONE SEEN

## 2015-02-12 MED ORDER — FLUCONAZOLE 100 MG PO TABS: ORAL_TABLET | ORAL | Status: DC

## 2015-02-12 NOTE — Patient Instructions (Signed)

## 2015-02-12 NOTE — Progress Notes (Signed)
Patient ID: Dawn Mcmahon, female   DOB: 02-03-1966, 49 y.o.   MRN: [id number] Presents with complaint of increased white discharge with vaginal irritation and itching for 1 week. Took 1 Diflucan with some relief. History of recurrent yeast, last yeast infection greater than one year ago. Also questions if partner unfaithful. LAVH with BSO. Denies urinary symptoms, abdominal pain or fever.  Exam: Appears well. External genitalia extremely erythematous, speculum exam scant white discharge wet prep negative. GC/Chlamydia culture from urethra.  Clinical yeast STD screen  Plan: Diflucan 200 mg today and 100 when necessary #20 no refills. Reviewed importance of yeast prevention, call if no relief of symptoms. GC/Chlamydia culture pending, HIV, hep B, C, RPR. Condoms encouraged if sexually active.

## 2015-02-13 ENCOUNTER — Encounter: Payer: Self-pay | Admitting: Women's Health

## 2015-02-13 LAB — HEPATITIS C ANTIBODY: HCV AB: NEGATIVE

## 2015-02-13 LAB — GC/CHLAMYDIA PROBE AMP
CT PROBE, AMP APTIMA: NEGATIVE
GC Probe RNA: NEGATIVE

## 2015-02-13 LAB — HIV ANTIBODY (ROUTINE TESTING W REFLEX): HIV: NONREACTIVE

## 2015-02-13 LAB — HEPATITIS B SURFACE ANTIGEN: Hepatitis B Surface Ag: NEGATIVE

## 2015-02-13 LAB — RPR

## 2015-05-28 HISTORY — PX: COLONOSCOPY W/ BIOPSIES: SHX1374

## 2015-05-31 ENCOUNTER — Encounter: Payer: Self-pay | Admitting: Gynecology

## 2015-06-04 ENCOUNTER — Encounter: Payer: Self-pay | Admitting: Gynecology

## 2015-06-04 ENCOUNTER — Ambulatory Visit (INDEPENDENT_AMBULATORY_CARE_PROVIDER_SITE_OTHER): Payer: BLUE CROSS/BLUE SHIELD | Admitting: Gynecology

## 2015-06-04 VITALS — BP 138/76 | Ht 62.0 in | Wt 142.0 lb

## 2015-06-04 DIAGNOSIS — R140 Abdominal distension (gaseous): Secondary | ICD-10-CM | POA: Diagnosis not present

## 2015-06-04 DIAGNOSIS — Z01419 Encounter for gynecological examination (general) (routine) without abnormal findings: Secondary | ICD-10-CM | POA: Diagnosis not present

## 2015-06-04 DIAGNOSIS — Z79890 Hormone replacement therapy (postmenopausal): Secondary | ICD-10-CM

## 2015-06-04 LAB — COMPREHENSIVE METABOLIC PANEL
ALK PHOS: 39 U/L (ref 33–130)
ALT: 13 U/L (ref 6–29)
AST: 16 U/L (ref 10–35)
Albumin: 4.3 g/dL (ref 3.6–5.1)
BILIRUBIN TOTAL: 0.3 mg/dL (ref 0.2–1.2)
BUN: 18 mg/dL (ref 7–25)
CALCIUM: 8.9 mg/dL (ref 8.6–10.4)
CO2: 26 mmol/L (ref 20–31)
Chloride: 105 mmol/L (ref 98–110)
Creat: 0.63 mg/dL (ref 0.50–1.05)
GLUCOSE: 93 mg/dL (ref 65–99)
Potassium: 3.8 mmol/L (ref 3.5–5.3)
Sodium: 139 mmol/L (ref 135–146)
Total Protein: 6.8 g/dL (ref 6.1–8.1)

## 2015-06-04 LAB — CBC WITH DIFFERENTIAL/PLATELET
BASOS ABS: 0 10*3/uL (ref 0.0–0.1)
Basophils Relative: 0 % (ref 0–1)
EOS PCT: 3 % (ref 0–5)
Eosinophils Absolute: 0.2 10*3/uL (ref 0.0–0.7)
HEMATOCRIT: 37.2 % (ref 36.0–46.0)
Hemoglobin: 12.8 g/dL (ref 12.0–15.0)
LYMPHS PCT: 31 % (ref 12–46)
Lymphs Abs: 2 10*3/uL (ref 0.7–4.0)
MCH: 32.2 pg (ref 26.0–34.0)
MCHC: 34.4 g/dL (ref 30.0–36.0)
MCV: 93.7 fL (ref 78.0–100.0)
MPV: 10.8 fL (ref 8.6–12.4)
Monocytes Absolute: 0.6 10*3/uL (ref 0.1–1.0)
Monocytes Relative: 9 % (ref 3–12)
Neutro Abs: 3.7 10*3/uL (ref 1.7–7.7)
Neutrophils Relative %: 57 % (ref 43–77)
PLATELETS: 209 10*3/uL (ref 150–400)
RBC: 3.97 MIL/uL (ref 3.87–5.11)
RDW: 12.8 % (ref 11.5–15.5)
WBC: 6.5 10*3/uL (ref 4.0–10.5)

## 2015-06-04 LAB — CHOLESTEROL, TOTAL: Cholesterol: 200 mg/dL (ref 125–200)

## 2015-06-04 MED ORDER — ESTRADIOL 0.52 MG/0.87 GM (0.06%) TD GEL: 1.0000 "application " | Freq: Every day | TRANSDERMAL | Status: DC

## 2015-06-04 NOTE — Progress Notes (Signed)
Dawn Mcmahon [birth year] [id number]   History:    49 y.o.  for annual gyn exam who has had issues with abdominal bloating. She saw the gastroenterologist recently did an upper endoscopy as well as colonoscopy. She states is some biopsies were obtained during the time of her endoscopy but she was informed that her colonoscopy was negative. Patient has had history of benign colon polyps removed at age 33. Review of her record indicated that several years ago she had a laparoscopic-assisted vaginal hysterectomy with right salpingo-oophorectomy. Patient early this year had a full STD screening which was negative consisting of the following: HIV, RPR, hepatitis B and C, GC and Chlamydia culture..Patient also has been followed by an endocrinologist Dr. 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 She is no longer any thyroid medication scheduled to see endocrinologist at the end of the year. She is in the process of completing her hepatitis B vaccine series.Her sister had breast cancer at the age of 66 as well as an aunt who also had breast cancer. Patient was tested for the BRCA one BRCA2 gene mutation and was negative. Patient has done well with the Elestrin  0.06% transdermal gel  which has helped her vasomotor symptoms. Patient declined the flu vaccine today. Patient with no past history of abnormal Pap smears.  Past medical history,surgical history, family history and social history were all reviewed and documented in the EPIC chart.  Gynecologic History No LMP recorded. Patient has had a hysterectomy. Contraception: status post hysterectomy Last Pap:2012esults were: normal Last mammogram: 2016 results were: normal  Obstetric History OB History  Gravida Para Term Preterm AB SAB TAB Ectopic Multiple Living  _0 # Outcome Date GA Lbr Len/2nd Weight Sex Delivery Anes PTL Lv  2 SAB           1 Term     F CS-Unspec  N Y        ROS: A ROS was performed and pertinent positives and negatives are included in the history.  GENERAL: No fevers or chills. HEENT: No change in vision, no earache, sore throat or sinus congestion. NECK: No pain or stiffness. CARDIOVASCULAR: No chest pain or pressure. No palpitations. PULMONARY: No shortness of breath, cough or wheeze. GASTROINTESTINAL: No abdominal pain, nausea, vomiting or diarrhea, melena or bright red blood per rectum. GENITOURINARY: No urinary frequency, urgency, hesitancy or dysuria. MUSCULOSKELETAL: No joint or muscle pain, no back pain, no recent trauma. DERMATOLOGIC: No rash, no itching, no lesions. ENDOCRINE: No polyuria, polydipsia, no heat or cold intolerance. No recent change in weight. HEMATOLOGICAL: No anemia or easy bruising or bleeding. NEUROLOGIC: No headache, seizures, numbness, tingling or weakness. PSYCHIATRIC: No depression, no loss of interest in normal activity or change in sleep pattern.     Exam: chaperone present  BP 138/76 mmHg  Ht _1  (1.575 m)  Wt 142 lb (64.411 kg)  BMI 25.97 kg/m2  Body mass index is 25.97 kg/(m^2).  General appearance : Well developed well nourished female. No acute distress HEENT: Eyes: no retinal hemorrhage or exudates,  Neck supple, trachea midline, no carotid bruits, no thyroidmegaly Lungs: Clear to auscultation, no rhonchi or wheezes, or rib retractions  Heart: Regular rate and rhythm, no murmurs or gallops Breast:Examined in sitting and supine position were symmetrical in appearance, no palpable masses or tenderness,  no skin retraction, no  nipple inversion, no nipple discharge, no skin discoloration, no axillary or supraclavicular lymphadenopathy Abdomen: no palpable masses or tenderness, no rebound or guarding Extremities: no edema or skin discoloration or tenderness  Pelvic:  Bartholin, Urethra, Skene Glands: Within normal limits             Vagina: No gross lesions or discharge  Cervic: Absent                 Uterus: Absent   Adnexa  Without masses or tenderness  Anus and perineum  normal   Rectovaginal  normal sphincter tone without palpated masses or tenderness             Hemoccult Recent colonoscopy   assessment/plan: 49 year old patient with past history of laparoscopic-assisted vaginal hysterectomy and right salpingo-oophorectomy has been evaluated recently by her gastroenterologist as a result of abdominal bloating especially after meals. Negative colonoscopy reported biopsies obtain at time of endoscopy results pending at this time and she will follow-up with her gastroenterologist. We want to schedule an ultrasound to better assess her remaining left ovary sometime next week. Pap smear no longer indicated according to the new guidelines. She was instructed to do her monthly breast exam. She is due for bone density study next year. The following blood work was ordered today: Screening cholesterol, comprehensive metabolic panel, TSH, CBC, and urinalysis.   Terrance Mass MD, 4:06 PM 06/04/2015

## 2015-06-05 LAB — URINALYSIS W MICROSCOPIC + REFLEX CULTURE
Bilirubin Urine: NEGATIVE
Casts: NONE SEEN [LPF]
Crystals: NONE SEEN [HPF]
GLUCOSE, UA: NEGATIVE
Hgb urine dipstick: NEGATIVE
Ketones, ur: NEGATIVE
LEUKOCYTES UA: NEGATIVE
Nitrite: NEGATIVE
Protein, ur: NEGATIVE
SPECIFIC GRAVITY, URINE: 1.027 (ref 1.001–1.035)
WBC UA: NONE SEEN WBC/HPF (ref <=5)
Yeast: NONE SEEN [HPF]
pH: 7 (ref 5.0–8.0)

## 2015-06-05 LAB — TSH: TSH: 0.377 u[IU]/mL (ref 0.350–4.500)

## 2015-06-06 LAB — URINE CULTURE: Colony Count: >=100000

## 2015-06-16 ENCOUNTER — Ambulatory Visit (INDEPENDENT_AMBULATORY_CARE_PROVIDER_SITE_OTHER): Payer: BLUE CROSS/BLUE SHIELD | Admitting: Gynecology

## 2015-06-16 ENCOUNTER — Other Ambulatory Visit: Payer: Self-pay | Admitting: Gynecology

## 2015-06-16 ENCOUNTER — Encounter: Payer: Self-pay | Admitting: Gynecology

## 2015-06-16 ENCOUNTER — Ambulatory Visit (INDEPENDENT_AMBULATORY_CARE_PROVIDER_SITE_OTHER): Payer: BLUE CROSS/BLUE SHIELD

## 2015-06-16 VITALS — BP 134/72 | Ht 62.0 in | Wt 142.0 lb

## 2015-06-16 DIAGNOSIS — N83202 Unspecified ovarian cyst, left side: Secondary | ICD-10-CM

## 2015-06-16 DIAGNOSIS — Z803 Family history of malignant neoplasm of breast: Secondary | ICD-10-CM | POA: Diagnosis not present

## 2015-06-16 DIAGNOSIS — R140 Abdominal distension (gaseous): Secondary | ICD-10-CM | POA: Diagnosis not present

## 2015-06-16 NOTE — Progress Notes (Signed)
   Patient is a 49 year old who presented to the office today to discuss her ultrasound. Patient was seen in the office for her annual gynecological examination was September 30 and had been complaining of abdominal bloating. She had previously been seen by the gastroenterologist would done an endoscopy and colonoscopy. She was informed of the colonoscopy was negative. Patient does have history of benign colon polyps that were removed at the age of 60.Review of her record indicated that several years ago she had a laparoscopic-assisted vaginal hysterectomy with right salpingo-oophorectomy.Her sister had breast cancer at the age of 45 as well as an aunt who also had breast cancer. Patient was tested for the BRCA one BRCA2 gene mutation and was negative. Patient has done well with the Elestrin 0.06% transdermal gel which has helped her vasomotor symptoms. Her ultrasound screen today demonstrated the following:  Absent uterus and right adnexa. Left ovary there was a thinwall cyst measuring 11 x 6 x 10 mm with a solid focus measuring 4 x 5 mm with no color-flow noted. Arterial blood flow was seen in the left ovary. No fluid in the cul-de-sac.  Assessment/plan: Patient was drawn family history of breast cancer she tested negative for the BRCA one BRCA2 gene mutation. He complained of bloating had a normal colonoscopy recently. Ultrasound demonstrated a small cyst on her remaining left ovary with a solid focus but no color-flow noted. A CA 125 will be done today and will follow-up with an ultrasound in 6 months since she is asymptomatic. If cyst is persistent or she becomes symptomatic will proceed with scheduling a laparoscopic left ovarian salpingo-oophorectomy. Literature information was provided.

## 2015-06-16 NOTE — Patient Instructions (Signed)
CA-125 Tumor Marker Test WHY AM I HAVING THIS TEST? This test is used to check the level of cancer antigen 125 (CA-125) in your blood. The CA-125 tumor marker test can be helpful in detecting ovarian cancer. The test is only performed if you are considered at high risk for ovarian cancer. Your health care provider may recommend this test if:  You have a strong family history of ovarian cancer.  You have a breast cancer antigen (BRCA) genetic defect. If you have already been diagnosed with ovarian cancer, your health care provider may use this test to help identify the extent of the disease and to monitor your response to treatment. WHAT KIND OF SAMPLE IS TAKEN? A blood sample is required for this test. It is usually collected by inserting a needle into a vein. HOW DO I PREPARE FOR THE TEST? There is no preparation required for this test. WHAT ARE THE REFERENCE RANGES? Reference ranges are considered healthy ranges established after testing a large group of healthy people. Reference ranges may vary among different people, labs, and hospitals. It is your responsibility to obtain your test results. Ask the lab or department performing the test when and how you will get your results. The reference range for this test is 0-35 units/mL or less than 35 kunits/L (SI units). WHAT DO THE RESULTS MEAN? Increased levels of CA-125 may indicate:  Certain types of cancer, including:  Ovarian cancer.  Pancreatic cancer.  Colon cancer.  Lung cancer.  Breast cancer.  Lymphoma.  Noncancerous (benign) disorders, including:  Cirrhosis.  Pregnancy.  Endometriosis.  Pancreatitis.  Pelvic inflammatory disease (PID). Talk with your health care provider to discuss your results, treatment options, and if necessary, the need for more tests. Talk with your health care provider if you have any questions about your results.   This information is not intended to replace advice given to you by your  health care provider. Make sure you discuss any questions you have with your health care provider.   Document Released: 09/12/2004 Document Revised: 09/11/2014 Document Reviewed: 01/08/2014 Elsevier Interactive Patient Education 2016 Elsevier Inc. Ovarian Cyst An ovarian cyst is a fluid-filled sac that forms on an ovary. The ovaries are small organs that produce eggs in women. Various types of cysts can form on the ovaries. Most are not cancerous. Many do not cause problems, and they often go away on their own. Some may cause symptoms and require treatment. Common types of ovarian cysts include:  Functional cysts--These cysts may occur every month during the menstrual cycle. This is normal. The cysts usually go away with the next menstrual cycle if the woman does not get pregnant. Usually, there are no symptoms with a functional cyst.  Endometrioma cysts--These cysts form from the tissue that lines the uterus. They are also called "chocolate cysts" because they become filled with blood that turns brown. This type of cyst can cause pain in the lower abdomen during intercourse and with your menstrual period.  Cystadenoma cysts--This type develops from the cells on the outside of the ovary. These cysts can get very big and cause lower abdomen pain and pain with intercourse. This type of cyst can twist on itself, cut off its blood supply, and cause severe pain. It can also easily rupture and cause a lot of pain.  Dermoid cysts--This type of cyst is sometimes found in both ovaries. These cysts may contain different kinds of body tissue, such as skin, teeth, hair, or cartilage. They usually do not cause  symptoms unless they get very big.  Theca lutein cysts--These cysts occur when too much of a certain hormone (human chorionic gonadotropin) is produced and overstimulates the ovaries to produce an egg. This is most common after procedures used to assist with the conception of a baby (in vitro  fertilization). CAUSES   Fertility drugs can cause a condition in which multiple large cysts are formed on the ovaries. This is called ovarian hyperstimulation syndrome.  A condition called polycystic ovary syndrome can cause hormonal imbalances that can lead to nonfunctional ovarian cysts. SIGNS AND SYMPTOMS  Many ovarian cysts do not cause symptoms. If symptoms are present, they may include:  Pelvic pain or pressure.  Pain in the lower abdomen.  Pain during sexual intercourse.  Increasing girth (swelling) of the abdomen.  Abnormal menstrual periods.  Increasing pain with menstrual periods.  Stopping having menstrual periods without being pregnant. DIAGNOSIS  These cysts are commonly found during a routine or annual pelvic exam. Tests may be ordered to find out more about the cyst. These tests may include:  Ultrasound.  X-ray of the pelvis.  CT scan.  MRI.  Blood tests. TREATMENT  Many ovarian cysts go away on their own without treatment. Your health care provider may want to check your cyst regularly for 2-3 months to see if it changes. For women in menopause, it is particularly important to monitor a cyst closely because of the higher rate of ovarian cancer in menopausal women. When treatment is needed, it may include any of the following:  A procedure to drain the cyst (aspiration). This may be done using a long needle and ultrasound. It can also be done through a laparoscopic procedure. This involves using a thin, lighted tube with a tiny camera on the end (laparoscope) inserted through a small incision.  Surgery to remove the whole cyst. This may be done using laparoscopic surgery or an open surgery involving a larger incision in the lower abdomen.  Hormone treatment or birth control pills. These methods are sometimes used to help dissolve a cyst. HOME CARE INSTRUCTIONS   Only take over-the-counter or prescription medicines as directed by your health care  provider.  Follow up with your health care provider as directed.  Get regular pelvic exams and Pap tests. SEEK MEDICAL CARE IF:   Your periods are late, irregular, or painful, or they stop.  Your pelvic pain or abdominal pain does not go away.  Your abdomen becomes larger or swollen.  You have pressure on your bladder or trouble emptying your bladder completely.  You have pain during sexual intercourse.  You have feelings of fullness, pressure, or discomfort in your stomach.  You lose weight for no apparent reason.  You feel generally ill.  You become constipated.  You lose your appetite.  You develop acne.  You have an increase in body and facial hair.  You are gaining weight, without changing your exercise and eating habits.  You think you are pregnant. SEEK IMMEDIATE MEDICAL CARE IF:   You have increasing abdominal pain.  You feel sick to your stomach (nauseous), and you throw up (vomit).  You develop a fever that comes on suddenly.  You have abdominal pain during a bowel movement.  Your menstrual periods become heavier than usual. MAKE SURE YOU:  Understand these instructions.  Will watch your condition.  Will get help right away if you are not doing well or get worse.   This information is not intended to replace advice given to  you by your health care provider. Make sure you discuss any questions you have with your health care provider.   Document Released: 08/21/2005 Document Revised: 08/26/2013 Document Reviewed: 04/28/2013 Elsevier Interactive Patient Education [employer].

## 2015-06-17 ENCOUNTER — Telehealth: Payer: Self-pay

## 2015-06-17 LAB — CA 125: CA 125: 3 U/mL (ref <35)

## 2015-06-17 NOTE — Telephone Encounter (Signed)
I which stated that would be a wonderful idea since she is menopausal and she went have to wait 6 months to see if this area on her ovary still persistent. We can pick a date for sometime in November or December 11 me know and I'll put in there for request

## 2015-06-17 NOTE — Telephone Encounter (Signed)
Patient asked if you might think it better to go ahead and remove the ovary now instead of waiting. She said she has met her deductible for the year and if going to end up having surgery better now.   Your note read:  Assessment/plan: Patient was drawn family history of breast cancer she tested negative for the BRCA one BRCA2 gene mutation. He complained of bloating had a normal colonoscopy recently. Ultrasound demonstrated a small cyst on her remaining left ovary with a solid focus but no color-flow noted. A CA 125 will be done today and will follow-up with an ultrasound in 6 months since she is asymptomatic. If cyst is persistent or she becomes symptomatic will proceed with scheduling a laparoscopic left ovarian salpingo-oophorectomy. Literature information was provided.

## 2015-06-17 NOTE — Telephone Encounter (Signed)
-----   Message from Ok EdwardsJuan H Fernandez, MD sent at 06/17/2015  8:17 AM EDT ----- Please inform patient CA-125 was normal

## 2015-06-18 NOTE — Telephone Encounter (Signed)
The Tuesday of that week on our OR time. I have to leave now. I will put in request pon Monday or this weekend.

## 2015-06-18 NOTE — Telephone Encounter (Signed)
Patient would like to go ahead and schedule to have ovary removed end of Nov.  Please send me an order. Thanks  She asked if she could do it on 07/27/15 as she would have the holiday to recover. I told her I would need to check if you were okay with that since the office is closed on Th/Fri. I will talk with her on Monday and she will be checking with her job to see if it would work with them as well.

## 2015-06-18 NOTE — Telephone Encounter (Signed)
Left message for patient that Dr. Glenetta HewJF is fine with her proceeding with surgery now. I asked her to call me and let me know if that is what she wants to do and I will let Dr. Glenetta HewJF know and he will give me order to schedule.

## 2015-06-21 NOTE — Telephone Encounter (Signed)
Just a reminder to send me an order for surgery. I will schedule her Tuesday prior to Thanksgiving as she called today and said that works with her schedule.

## 2015-06-21 NOTE — Telephone Encounter (Signed)
Thank you for reminding  me request in

## 2015-06-30 ENCOUNTER — Other Ambulatory Visit: Payer: Self-pay

## 2015-06-30 MED ORDER — ESTRADIOL 0.52 MG/0.87 GM (0.06%) TD GEL: 1.0000 "application " | Freq: Every day | TRANSDERMAL | Status: DC

## 2015-07-13 NOTE — Patient Instructions (Signed)
Your procedure is scheduled on:  Tuesday, Nov. 22, 2016  [facility]:  7:30 AM  Pick up the phone at the desk and dial [phone].  Call this number if you have problems the morning of surgery: [phone].  Remember: Do NOT eat food or drink after:  Midnight Monday, Nov. 21, 2016 Take these medicines the morning of surgery with a SIP OF WATER:  None  Do NOT wear jewelry (body piercing), metal hair clips/bobby pins, make-up, or nail polish. Do NOT wear lotions, powders, or perfumes.  You may wear deoderant. Do NOT shave for 48 hours prior to surgery. Do NOT bring valuables to the hospital. Contacts, dentures, or bridgework may not be worn into surgery. Have a responsible adult drive you home and stay with you for 24 hours after your procedure

## 2015-07-14 ENCOUNTER — Encounter (HOSPITAL_COMMUNITY)
Admission: RE | Admit: 2015-07-14 | Discharge: 2015-07-14 | Disposition: A | Discharge status: Home / Self Care | Payer: BLUE CROSS/BLUE SHIELD | Source: Ambulatory Visit | Attending: Gynecology | Admitting: Gynecology

## 2015-07-14 ENCOUNTER — Encounter (HOSPITAL_COMMUNITY): Payer: Self-pay

## 2015-07-14 DIAGNOSIS — Z803 Family history of malignant neoplasm of breast: Secondary | ICD-10-CM | POA: Diagnosis not present

## 2015-07-14 DIAGNOSIS — N83202 Unspecified ovarian cyst, left side: Secondary | ICD-10-CM | POA: Diagnosis not present

## 2015-07-14 DIAGNOSIS — Z01818 Encounter for other preprocedural examination: Secondary | ICD-10-CM | POA: Insufficient documentation

## 2015-07-14 HISTORY — DX: Headache, unspecified: R51.9

## 2015-07-14 HISTORY — DX: Headache: R51

## 2015-07-14 LAB — CBC
HEMATOCRIT: 38.2 % (ref 36.0–46.0)
HEMOGLOBIN: 12.7 g/dL (ref 12.0–15.0)
MCH: 31.6 pg (ref 26.0–34.0)
MCHC: 33.2 g/dL (ref 30.0–36.0)
MCV: 95 fL (ref 78.0–100.0)
Platelets: 186 10*3/uL (ref 150–400)
RBC: 4.02 MIL/uL (ref 3.87–5.11)
RDW: 12.4 % (ref 11.5–15.5)
WBC: 6.2 10*3/uL (ref 4.0–10.5)

## 2015-07-22 ENCOUNTER — Ambulatory Visit (INDEPENDENT_AMBULATORY_CARE_PROVIDER_SITE_OTHER): Payer: BLUE CROSS/BLUE SHIELD | Admitting: Gynecology

## 2015-07-22 ENCOUNTER — Encounter: Payer: Self-pay | Admitting: Gynecology

## 2015-07-22 VITALS — BP 138/80 | Ht 62.0 in | Wt 142.0 lb

## 2015-07-22 DIAGNOSIS — R102 Pelvic and perineal pain: Secondary | ICD-10-CM | POA: Diagnosis not present

## 2015-07-22 DIAGNOSIS — Z01818 Encounter for other preprocedural examination: Secondary | ICD-10-CM | POA: Diagnosis not present

## 2015-07-22 DIAGNOSIS — N83202 Unspecified ovarian cyst, left side: Secondary | ICD-10-CM | POA: Diagnosis not present

## 2015-07-22 MED ORDER — MEPERIDINE HCL 50 MG PO TABS: 50.0000 mg | ORAL_TABLET | ORAL | Status: DC | PRN

## 2015-07-22 NOTE — Progress Notes (Signed)
Dawn Mcmahon is an 49 y.o. female for her preoperative examination and consultation prior to her scheduled laparoscopic left salpingo-oophorectomy next week. Her history is as follows:  Patient was seen in the office for her annual gynecological examination was September 30 and had been complaining of abdominal bloating. She had previously been seen by the gastroenterologist would done an endoscopy and colonoscopy. She was informed of the colonoscopy was negative. Patient does have history of benign colon polyps that were removed at the age of 49.Review of her record indicated that several years ago she had a laparoscopic-assisted vaginal hysterectomy with right salpingo-oophorectomy.Her sister had breast cancer at the age of 29 as well as an aunt who also had breast cancer. Patient was tested for the BRCA one BRCA2 gene mutation and was negative. Patient has done well with the Elestrin 0.06% transdermal gel which has helped her vasomotor symptoms. Her ultrasound done at last office visit on 06/16/2015 to return to the following:  Absent uterus and right adnexa. Left ovary there was a thinwall cyst measuring 11 x 6 x 10 mm with a solid focus measuring 4 x 5 mm with no color-flow noted. Arterial blood flow was seen in the left ovary. No fluid in the cul-de-sac. Patient had a normal CA-125 with a value of 3  Pertinent Gynecological History: Menses: Post-hysterectomy Bleeding: Post hysterectomy Contraception: Hysterectomy DES exposure: unknown Blood transfusions: none Sexually transmitted diseases: no past history Previous GYN Procedures: One cesarean section, 1 D&C for elective abortion, 1 laparoscopic tubal ligation, and laparoscopic-assisted vaginal hysterectomy with right salpingo-oophorectomy.  Last mammogram: normal Date: 2016 Last pap: normal Date: 2012 OB History: G 2, P 1 A1  Menstrual History: Menarche age: 89 No LMP recorded. Patient has had a hysterectomy.    Past Medical  History  Diagnosis Date  . BRCA1 negative 06/2010  . BRCA2 negative 06/2010  . Vertigo   . Depression   . Thyrotoxicosis   . Benign colon polyp   . Chronic kidney disease     kidney stone  . Kidney stone   . Headache     Migraines    Past Surgical History  Procedure Laterality Date  . Dilation and curettage of uterus    . Cesarean section    . Abdominal hysterectomy  04/15/2007    [facility]  . Lumbar disc surgery      L4-L5  . Tubal ligation    . Tonsillectomy    . Cystoscopy w/ ureteral stent placement  03/18/2012    Procedure: CYSTOSCOPY WITH RETROGRADE PYELOGRAM/URETERAL STENT PLACEMENT;  Surgeon: Reece Packer, MD;  Location: [facility];  Service: Urology;  Laterality: Right;  . Lithotripsy  AUGUST 2013  . Colonoscopy w/ biopsies  05/28/15  . Upper gi endoscopy      Family History  Problem Relation Age of Onset  . Cancer Mother 57    COLON  . Breast cancer Sister 60  . Cancer Father     LYMPHOMA  . Breast cancer Maternal Aunt   . Cancer Maternal Grandmother     BLADDER    Social History:  reports that she has quit smoking. Her smoking use included E-cigarettes. She has never used smokeless tobacco. She reports that she drinks alcohol. She reports that she does not use illicit drugs.  Allergies:  Allergies  Allergen Reactions  . Omnicef [Cefdinir] Other (See Comments)    disoriented  . Tramadol Nausea And Vomiting     (Not in a hospital admission)  REVIEW OF SYSTEMS: A ROS was performed and pertinent positives and negatives are included in the history.  GENERAL: No fevers or chills. HEENT: No change in vision, no earache, sore throat or sinus congestion. NECK: No pain or stiffness. CARDIOVASCULAR: No chest pain or pressure. No palpitations. PULMONARY: No shortness of breath, cough or wheeze. GASTROINTESTINAL: No abdominal pain, nausea, vomiting or diarrhea, melena or bright red blood per rectum. GENITOURINARY: No urinary frequency, urgency, hesitancy or  dysuria. MUSCULOSKELETAL: No joint or muscle pain, no back pain, no recent trauma. DERMATOLOGIC: No rash, no itching, no lesions. ENDOCRINE: No polyuria, polydipsia, no heat or cold intolerance. No recent change in weight. HEMATOLOGICAL: No anemia or easy bruising or bleeding. NEUROLOGIC: No headache, seizures, numbness, tingling or weakness. PSYCHIATRIC: No depression, no loss of interest in normal activity or change in sleep pattern.     Blood pressure 138/80, height '5\' 2"'  (1.575 m), weight 142 lb (64.411 kg).  Physical Exam:  HEENT:unremarkable Neck:Supple, midline, no thyroid megaly, no carotid bruits Lungs:  Clear to auscultation no rhonchi's or wheezes Heart:Regular rate and rhythm, no murmurs or gallops Breast Exam: Examined earlier this year normal Abdomen: Soft nontender no rebound or guarding subumbilical scar from previous laparoscopy as well as Pfannenstiel scar Pelvic:BUS within normal limits Vagina: Vaginal cuff intact Cervix: Absent Uterus: Absent Adnexa: No large masses although tender in the left lower quadrant Extremities: No cords, no edema Rectal: Not examined  Assessment: Patient with persistent left ovarian cyst complaining of left lower quadrant discomfort will be scheduled to undergo laparoscopic left salpingo-oophorectomy possible laparotomy. The following risk factors discussed:                        Patient was counseled as to the risk of surgery to include the following:  1. Infection (prohylactic antibiotics will be administered)  2. DVT/Pulmonary Embolism (prophylactic pneumo compression stockings will be used)  3.Trauma to internal organs requiring additional surgical procedure to repair any injury to     Internal organs requiring perhaps additional hospitalization days.  4.Hemmorhage requiring transfusion and blood products which carry risks such as             anaphylactic reaction, hepatitis and AIDS  Patient had received literature information on  the procedure scheduled and all her questions were answered and fully accepts all risk.   [facility] HMD4:27 PMTD'@Note' Terrance Mass 07/22/2015, 3:47 PM  Note: This dictation was prepared with  Dragon/digital dictation along withSmart phrase technology. Any transcriptional errors that result from this process are unintentional.

## 2015-07-22 NOTE — Anesthesia Preprocedure Evaluation (Addendum)
Anesthesia Evaluation  Patient identified by MRN, date of birth, ID band Patient awake    Reviewed: Allergy & Precautions, H&P , NPO status , Patient's Chart, lab work & pertinent test results  Airway Mallampati: II  TM Distance: >3 FB Neck ROM: Full    Dental no notable dental hx. (+) Teeth Intact, Dental Advisory Given   Pulmonary neg pulmonary ROS, former smoker,    Pulmonary exam normal breath sounds clear to auscultation       Cardiovascular negative cardio ROS Normal cardiovascular exam Rhythm:Regular Rate:Normal     Neuro/Psych  Headaches, Depression negative neurological ROS  negative psych ROS   GI/Hepatic negative GI ROS, Neg liver ROS,   Endo/Other  negative endocrine ROSHyperthyroidism   Renal/GU negative Renal ROSStones  negative genitourinary   Musculoskeletal negative musculoskeletal ROS (+)   Abdominal (+) + obese,   Peds negative pediatric ROS (+)  Hematology negative hematology ROS (+) 12/38   Anesthesia Other Findings   Reproductive/Obstetrics negative OB ROS                            Anesthesia Physical  Anesthesia Plan  ASA: II  Anesthesia Plan: General   Post-op Pain Management:    Induction: Intravenous  Airway Management Planned: Oral ETT  Additional Equipment:   Intra-op Plan:   Post-operative Plan: Extubation in OR  Informed Consent: I have reviewed the patients History and Physical, chart, labs and discussed the procedure including the risks, benefits and alternatives for the proposed anesthesia with the patient or authorized representative who has indicated his/her understanding and acceptance.   Dental advisory given  Plan Discussed with: CRNA  Anesthesia Plan Comments:         Anesthesia Quick Evaluation

## 2015-07-22 NOTE — Patient Instructions (Signed)
Diagnostic Laparoscopy A diagnostic laparoscopy is a procedure to diagnose diseases in the abdomen. During the procedure, a thin, lighted, pencil-sized instrument called a laparoscope is inserted into the abdomen through an incision. The laparoscope allows your health care provider to look at the organs inside your body. LET YOUR HEALTH CARE PROVIDER KNOW ABOUT:  Any allergies you have.  All medicines you are taking, including vitamins, herbs, eye drops, creams, and over-the-counter medicines.  Previous problems you or members of your family have had with the use of anesthetics.  Any blood disorders you have.  Previous surgeries you have had.  Medical conditions you have. RISKS AND COMPLICATIONS  Generally, this is a safe procedure. However, problems can occur, which may include:  Infection.  Bleeding.  Damage to other organs.  Allergic reaction to the anesthetics used during the procedure. BEFORE THE PROCEDURE  Do not eat or drink anything after midnight on the night before the procedure or as directed by your health care provider.  Ask your health care provider about:  Changing or stopping your regular medicines.  Taking medicines such as aspirin and ibuprofen. These medicines can thin your blood. Do not take these medicines before your procedure if your health care provider instructs you not to.  Plan to have someone take you home after the procedure. PROCEDURE  You may be given a medicine to help you relax (sedative).  You will be given a medicine to make you sleep (general anesthetic).  Your abdomen will be inflated with a gas. This will make your organs easier to see.  Small incisions will be made in your abdomen.  A laparoscope and other small instruments will be inserted into the abdomen through the incisions.  A tissue sample may be removed from an organ in the abdomen for examination.  The instruments will be removed from the abdomen.  The gas will be  released.  The incisions will be closed with stitches (sutures). AFTER THE PROCEDURE  Your blood pressure, heart rate, breathing rate, and blood oxygen level will be monitored often until the medicines you were given have worn off.   This information is not intended to replace advice given to you by your health care provider. Make sure you discuss any questions you have with your health care provider.   Document Released: 11/27/2000 Document Revised: 05/12/2015 Document Reviewed: 04/03/2014 Elsevier Interactive Patient Education 2016 Elsevier Inc.  

## 2015-07-26 MED ORDER — GENTAMICIN SULFATE 40 MG/ML IJ SOLN
INTRAVENOUS | Status: AC
  Administered 2015-07-27: 113 mL via INTRAVENOUS
  Filled 2015-07-26: qty 7

## 2015-07-27 ENCOUNTER — Ambulatory Visit (HOSPITAL_COMMUNITY): Payer: BLUE CROSS/BLUE SHIELD | Admitting: Anesthesiology

## 2015-07-27 ENCOUNTER — Encounter (HOSPITAL_COMMUNITY)
Admission: RE | Disposition: A | Discharge status: Home / Self Care | Payer: Self-pay | Source: Ambulatory Visit | Attending: Gynecology

## 2015-07-27 ENCOUNTER — Ambulatory Visit (HOSPITAL_COMMUNITY)
Admission: RE | Admit: 2015-07-27 | Discharge: 2015-07-27 | Disposition: A | Discharge status: Home / Self Care | Payer: BLUE CROSS/BLUE SHIELD | Source: Ambulatory Visit | Attending: Gynecology | Admitting: Gynecology

## 2015-07-27 DIAGNOSIS — Z87891 Personal history of nicotine dependence: Secondary | ICD-10-CM | POA: Insufficient documentation

## 2015-07-27 DIAGNOSIS — Z86010 Personal history of colonic polyps: Secondary | ICD-10-CM | POA: Diagnosis not present

## 2015-07-27 DIAGNOSIS — Z87442 Personal history of urinary calculi: Secondary | ICD-10-CM | POA: Insufficient documentation

## 2015-07-27 DIAGNOSIS — N83202 Unspecified ovarian cyst, left side: Secondary | ICD-10-CM | POA: Diagnosis not present

## 2015-07-27 DIAGNOSIS — Z803 Family history of malignant neoplasm of breast: Secondary | ICD-10-CM | POA: Diagnosis not present

## 2015-07-27 DIAGNOSIS — Z881 Allergy status to other antibiotic agents status: Secondary | ICD-10-CM | POA: Insufficient documentation

## 2015-07-27 DIAGNOSIS — Z90710 Acquired absence of both cervix and uterus: Secondary | ICD-10-CM | POA: Insufficient documentation

## 2015-07-27 DIAGNOSIS — R102 Pelvic and perineal pain: Secondary | ICD-10-CM | POA: Diagnosis not present

## 2015-07-27 HISTORY — PX: LAPAROSCOPIC UNILATERAL SALPINGO OOPHERECTOMY: SHX5935

## 2015-07-27 SURGERY — SALPINGO-OOPHORECTOMY, UNILATERAL, LAPAROSCOPIC
Anesthesia: General | Laterality: Left

## 2015-07-27 MED ORDER — LACTATED RINGERS IV SOLN
INTRAVENOUS | Status: DC
  Administered 2015-07-27 (×2): via INTRAVENOUS

## 2015-07-27 MED ORDER — ONDANSETRON HCL 4 MG/2ML IJ SOLN
INTRAMUSCULAR | Status: DC | PRN
  Administered 2015-07-27: 4 mg via INTRAVENOUS

## 2015-07-27 MED ORDER — MEPERIDINE HCL 25 MG/ML IJ SOLN: 6.2500 mg | INTRAMUSCULAR | Status: DC | PRN

## 2015-07-27 MED ORDER — SCOPOLAMINE 1 MG/3DAYS TD PT72
1.0000 | MEDICATED_PATCH | Freq: Once | TRANSDERMAL | Status: DC
  Administered 2015-07-27: 1.5 mg via TRANSDERMAL

## 2015-07-27 MED ORDER — PROPOFOL 10 MG/ML IV BOLUS
INTRAVENOUS | Status: AC
  Filled 2015-07-27: qty 20

## 2015-07-27 MED ORDER — FENTANYL CITRATE (PF) 250 MCG/5ML IJ SOLN
INTRAMUSCULAR | Status: AC
  Filled 2015-07-27: qty 5

## 2015-07-27 MED ORDER — PROMETHAZINE HCL 25 MG/ML IJ SOLN: 6.2500 mg | INTRAMUSCULAR | Status: DC | PRN

## 2015-07-27 MED ORDER — BUPIVACAINE HCL (PF) 0.25 % IJ SOLN
INTRAMUSCULAR | Status: DC | PRN
  Administered 2015-07-27: 10 mL

## 2015-07-27 MED ORDER — GLYCOPYRROLATE 0.2 MG/ML IJ SOLN
INTRAMUSCULAR | Status: AC
  Filled 2015-07-27: qty 2

## 2015-07-27 MED ORDER — KETOROLAC TROMETHAMINE 30 MG/ML IJ SOLN
INTRAMUSCULAR | Status: AC
  Filled 2015-07-27: qty 1

## 2015-07-27 MED ORDER — GLYCOPYRROLATE 0.2 MG/ML IJ SOLN
INTRAMUSCULAR | Status: DC | PRN
  Administered 2015-07-27: 0.4 mg via INTRAVENOUS

## 2015-07-27 MED ORDER — ROCURONIUM BROMIDE 100 MG/10ML IV SOLN
INTRAVENOUS | Status: AC
  Filled 2015-07-27: qty 1

## 2015-07-27 MED ORDER — DEXAMETHASONE SODIUM PHOSPHATE 10 MG/ML IJ SOLN
INTRAMUSCULAR | Status: DC | PRN
  Administered 2015-07-27: 4 mg via INTRAVENOUS

## 2015-07-27 MED ORDER — MIDAZOLAM HCL 2 MG/2ML IJ SOLN
INTRAMUSCULAR | Status: AC
  Filled 2015-07-27: qty 2

## 2015-07-27 MED ORDER — SCOPOLAMINE 1 MG/3DAYS TD PT72
MEDICATED_PATCH | TRANSDERMAL | Status: AC
  Administered 2015-07-27: 1.5 mg via TRANSDERMAL
  Filled 2015-07-27: qty 1

## 2015-07-27 MED ORDER — BUPIVACAINE HCL (PF) 0.25 % IJ SOLN
INTRAMUSCULAR | Status: AC
  Filled 2015-07-27: qty 30

## 2015-07-27 MED ORDER — FENTANYL CITRATE (PF) 100 MCG/2ML IJ SOLN
INTRAMUSCULAR | Status: AC
  Administered 2015-07-27: 25 ug via INTRAVENOUS
  Filled 2015-07-27: qty 2

## 2015-07-27 MED ORDER — LIDOCAINE HCL (CARDIAC) 20 MG/ML IV SOLN
INTRAVENOUS | Status: DC | PRN
  Administered 2015-07-27: 60 mg via INTRAVENOUS

## 2015-07-27 MED ORDER — ONDANSETRON HCL 4 MG/2ML IJ SOLN
INTRAMUSCULAR | Status: AC
  Filled 2015-07-27: qty 2

## 2015-07-27 MED ORDER — DEXAMETHASONE SODIUM PHOSPHATE 4 MG/ML IJ SOLN
INTRAMUSCULAR | Status: AC
  Filled 2015-07-27: qty 1

## 2015-07-27 MED ORDER — FENTANYL CITRATE (PF) 100 MCG/2ML IJ SOLN
25.0000 ug | INTRAMUSCULAR | Status: DC | PRN
  Administered 2015-07-27: 25 ug via INTRAVENOUS

## 2015-07-27 MED ORDER — MIDAZOLAM HCL 2 MG/2ML IJ SOLN
INTRAMUSCULAR | Status: DC | PRN
  Administered 2015-07-27: 2 mg via INTRAVENOUS

## 2015-07-27 MED ORDER — FENTANYL CITRATE (PF) 100 MCG/2ML IJ SOLN
INTRAMUSCULAR | Status: DC | PRN
  Administered 2015-07-27 (×3): 50 ug via INTRAVENOUS

## 2015-07-27 MED ORDER — PROPOFOL 10 MG/ML IV BOLUS
INTRAVENOUS | Status: DC | PRN
  Administered 2015-07-27: 130 mg via INTRAVENOUS
  Administered 2015-07-27: 40 mg via INTRAVENOUS

## 2015-07-27 MED ORDER — ROCURONIUM BROMIDE 100 MG/10ML IV SOLN
INTRAVENOUS | Status: DC | PRN
  Administered 2015-07-27: 20 mg via INTRAVENOUS

## 2015-07-27 MED ORDER — KETOROLAC TROMETHAMINE 30 MG/ML IJ SOLN
INTRAMUSCULAR | Status: DC | PRN
  Administered 2015-07-27: 30 mg via INTRAVENOUS

## 2015-07-27 MED ORDER — LIDOCAINE HCL (CARDIAC) 20 MG/ML IV SOLN
INTRAVENOUS | Status: AC
  Filled 2015-07-27: qty 5

## 2015-07-27 MED ORDER — NEOSTIGMINE METHYLSULFATE 10 MG/10ML IV SOLN
INTRAVENOUS | Status: AC
  Filled 2015-07-27: qty 1

## 2015-07-27 MED ORDER — NEOSTIGMINE METHYLSULFATE 10 MG/10ML IV SOLN
INTRAVENOUS | Status: DC | PRN
  Administered 2015-07-27: 3 mg via INTRAVENOUS

## 2015-07-27 SURGICAL SUPPLY — 29 items
BARRIER ADHS 3X4 INTERCEED (GAUZE/BANDAGES/DRESSINGS) IMPLANT
CABLE HIGH FREQUENCY MONO STRZ (ELECTRODE) IMPLANT
CLOTH BEACON ORANGE TIMEOUT ST (SAFETY) ×3 IMPLANT
COVER MAYO STAND STRL (DRAPES) ×3 IMPLANT
DRSG OPSITE POSTOP 3X4 (GAUZE/BANDAGES/DRESSINGS) ×3 IMPLANT
FILTER SMOKE EVAC LAPAROSHD (FILTER) IMPLANT
GLOVE BIOGEL PI IND STRL 7.0 (GLOVE) ×1 IMPLANT
GLOVE BIOGEL PI IND STRL 8 (GLOVE) ×1 IMPLANT
GLOVE BIOGEL PI INDICATOR 7.0 (GLOVE) ×2
GLOVE BIOGEL PI INDICATOR 8 (GLOVE) ×2
GLOVE ECLIPSE 7.5 STRL STRAW (GLOVE) ×6 IMPLANT
GOWN STRL REUS W/TWL LRG LVL3 (GOWN DISPOSABLE) ×6 IMPLANT
LIQUID BAND (GAUZE/BANDAGES/DRESSINGS) ×3 IMPLANT
NS IRRIG 1000ML POUR BTL (IV SOLUTION) IMPLANT
PACK LAPAROSCOPY BASIN (CUSTOM PROCEDURE TRAY) ×3 IMPLANT
POUCH SPECIMEN RETRIEVAL 10MM (ENDOMECHANICALS) ×3 IMPLANT
SET IRRIG TUBING LAPAROSCOPIC (IRRIGATION / IRRIGATOR) ×3 IMPLANT
SHEARS HARMONIC ACE PLUS 36CM (ENDOMECHANICALS) ×3 IMPLANT
SLEEVE XCEL OPT CAN 5 100 (ENDOMECHANICALS) ×3 IMPLANT
SOLUTION ELECTROLUBE (MISCELLANEOUS) IMPLANT
SUT VIC AB 3-0 PS2 18 (SUTURE) ×2
SUT VIC AB 3-0 PS2 18XBRD (SUTURE) ×1 IMPLANT
SUT VICRYL 0 UR6 27IN ABS (SUTURE) ×3 IMPLANT
TOWEL OR 17X24 6PK STRL BLUE (TOWEL DISPOSABLE) ×6 IMPLANT
TRAY FOLEY CATH SILVER 14FR (SET/KITS/TRAYS/PACK) ×3 IMPLANT
TROCAR XCEL NON-BLD 11X100MML (ENDOMECHANICALS) ×3 IMPLANT
TROCAR XCEL NON-BLD 5MMX100MML (ENDOMECHANICALS) ×3 IMPLANT
TROCAR XCEL OPT SLVE 5M 100M (ENDOMECHANICALS) ×2 IMPLANT
WARMER LAPAROSCOPE (MISCELLANEOUS) ×3 IMPLANT

## 2015-07-27 NOTE — Discharge Instructions (Signed)
DISCHARGE INSTRUCTIONS: Laparoscopy  The following instructions have been prepared to help you care for yourself upon your return home today.  May remove Scop patch after 07/30/15  May take Ibuprofen after 3:20 pm as needed for pain.  Wound care:  Do not get the incision wet for the first 24 hours. The incision should be kept clean and dry.  The honeycomb dressing may be removed 2 days after surgery.  Should the incision become sore, red, and swollen after the first week, check with your doctor.  Personal hygiene:  Shower the day after your procedure.  Activity and limitations:  Do NOT drive or operate any equipment today.  Do NOT lift anything more than 15 pounds for 2-3 weeks after surgery.  Do NOT rest in bed all day.  Walking is encouraged. Walk each day, starting slowly with 5-minute walks 3 or 4 times a day. Slowly increase the length of your walks.  Walk up and down stairs slowly.  Do NOT do strenuous activities, such as golfing, playing tennis, bowling, running, biking, weight lifting, gardening, mowing, or vacuuming for 2-4 weeks. Ask your doctor when it is okay to start.  Diet: Eat a light meal as desired this evening. You may resume your usual diet tomorrow.  Return to work: This is dependent on the type of work you do. For the most part you can return to a desk job within a week of surgery. If you are more active at work, please discuss this with your doctor.  What to expect after your surgery: You may have a slight burning sensation when you urinate on the first day. You may have a very small amount of blood in the urine. Expect to have a small amount of vaginal discharge/light bleeding for 1-2 weeks. It is not unusual to have abdominal soreness and bruising for up to 2 weeks. You may be tired and need more rest for about 1 week. You may experience shoulder pain for 24-72 hours. Lying flat in bed may relieve it.  Call your doctor for any of the following:   Develop a fever of 100.4 or greater  Inability to urinate 6 hours after discharge from hospital  Severe pain not relieved by pain medications  Persistent of heavy bleeding at incision site  Redness or swelling around incision site after a week  Increasing nausea or vomiting

## 2015-07-27 NOTE — Transfer of Care (Signed)
Immediate Anesthesia Transfer of Care Note  Patient: Dawn FrancoisKaren E Tibbetts  Procedure(s) Performed: Procedure(s): LAPAROSCOPIC UNILATERAL SALPINGO OOPHORECTOMY and lysis of pelvic adhesions (Left)  Patient Location: PACU  Anesthesia Type:General  Level of Consciousness: awake, alert , oriented and patient cooperative  Airway & Oxygen Therapy: Patient Spontanous Breathing and Patient connected to nasal cannula oxygen  Post-op Assessment: Report given to RN and Post -op Vital signs reviewed and stable  Post vital signs: Reviewed and stable  Last Vitals:  Filed Vitals:   07/27/15 0729 07/27/15 0945  BP: 144/81   Pulse: 70   Temp: 36.6 C 36.4 C  Resp: 20     Complications: No apparent anesthesia complications

## 2015-07-27 NOTE — Anesthesia Postprocedure Evaluation (Signed)
Anesthesia Post Note  Patient: Dawn FrancoisKaren E Chandler  Procedure(s) Performed: Procedure(s) (LRB): LAPAROSCOPIC UNILATERAL SALPINGO OOPHORECTOMY and lysis of pelvic adhesions (Left)  Patient location during evaluation: PACU Anesthesia Type: General Level of consciousness: awake and alert Pain management: pain level controlled Vital Signs Assessment: post-procedure vital signs reviewed and stable Respiratory status: spontaneous breathing, nonlabored ventilation, respiratory function stable and patient connected to nasal cannula oxygen Cardiovascular status: blood pressure returned to baseline and stable Postop Assessment: No signs of nausea or vomiting Anesthetic complications: no    Last Vitals:  Filed Vitals:   07/27/15 0729 07/27/15 0945  BP: 144/81 117/73  Pulse: 70 73  Temp: 36.6 C 36.4 C  Resp: 20 20    Last Pain: There were no vitals filed for this visit.               Kasyn Rolph

## 2015-07-27 NOTE — Op Note (Signed)
Operative Note  07/27/2015  10:06 AM  PATIENT:  Dawn FrancoisKaren E Mcmahon  49 y.o. female  PRE-OPERATIVE DIAGNOSIS:  persistant left ovarian cyst, family histoy of breast cancer  POST-OPERATIVE DIAGNOSIS:  persistant left ovarian cyst, family histoy of breast cancer  PROCEDURE:  Procedure(s): LAPAROSCOPIC UNILATERAL SALPINGO OOPHORECTOMY and lysis of pelvic adhesions  SURGEON:  Surgeon(s): Ok EdwardsJuan H Jaishon Krisher, MD Dara Lordsimothy P Fontaine, MD  ANESTHESIA:   general  FINDINGS: Cul-de-sac adhesions were noted. Left tube slightly adhered to the left pelvic sidewall otherwise normal appearance. Absence of uterus. Absence of right tube and ovary. Smooth liver surface and gallbladder seen. Appendix not visualized.  DESCRIPTION OF OPERATION: The patient was taken to the operating room where she underwent successful general endotracheal anesthesia. Patient had previously received antibiotic prophylactically as well as had PSA stockings for prophylaxis as well. A timeout was undertaken outlining the procedure to be performed. The patient was properly marked on her left side which was the adnexa that was to be removed. Patient's abdomen vagina and perineum were prepped and draped in usual sterile fashion. A Foley catheter was inserted to monitor urinary output. Due to the fact the patient had had several laparoscopic procedures in the past a nasogastric tube was placed to decompress her stomach. A high left upper abdominal incision was made 2 fingerbreadths underneath the left lower rib mid position and a 5 mm Optiview trocar was introduced. A pneumoperitoneum was established. Visualization underneath the umbilicus did not demonstrate any evidence of bowel adhered to the anterior abdominal wall so a semilunar incision was made underneath the umbilicus and a 10/11 mm trocar was inserted under direct visualization. An additional 5 mm port was placed on the patient's right lower abdomen once again under laparoscopic  guidance. Systematic inspection of the pelvic cavity demonstrated the following:  Cul-de-sac adhesions were noted. Left tube slightly adhered to the left pelvic sidewall otherwise normal appearance. Absence of uterus. Absence of right tube and ovary. Smooth liver surface and gallbladder seen. Appendix not visualized.  The cul-de-sac adhesions were lysed with the Harmonic scalpel. The left tube and ovary were placed under tension the left ureter was identified as medial to the point of transection of the infundibulopelvic ligament. The left infundibulopelvic ligament was coaptated and transected with Harmonic scalpel completely freeing the left tube and ovary. A laparoscopic Endopouch was placed through the 10/11 mm port site and retrieved and passed off for histological evaluation. Systematic inspection of the pelvic cavity demonstrated adequate hemostasis. The pelvic cavity was copiously irrigated with normal saline solution. The pneumoperitoneum was removed. Instruments were removed. The subumbilical fascia was reapproximated with a running stitch of 0 Vicryl suture and the subcutaneous tissue was reapproximated with 3-0 Vicryl suture. All 3 incision ports skin sites was reapproximated with Dermabond glue. 0.25% Marcaine was infiltrated all 3 incision ports for postoperative analgesia for a total 10 cc. The nasogastric tube was removed. The patient was extubated transferred to recovery room stable vital signs. She received 30 mg of Toradol in route to the recovery room. Blood loss was minimal.  ESTIMATED BLOOD LOSS: Minimal   Intake/Output Summary (Last 24 hours) at 07/27/15 1006 Last data filed at 07/27/15 0943  Gross per 24 hour  Intake   1400 ml  Output     55 ml  Net   1345 ml     BLOOD ADMINISTERED:none   LOCAL MEDICATIONS USED:  MARCAINE   0.25% for a total of 10 cc injected all 3 incision ports for  postoperative analgesia  SPECIMEN:  Source of Specimen:  Left fallopian tube and  ovary  DISPOSITION OF SPECIMEN:  PATHOLOGY  COUNTS:  YES  PLAN OF CARE: Transfer to PACU  [facility] HMD10:06 AMTD@

## 2015-07-27 NOTE — Interval H&P Note (Signed)
History and Physical Interval Note:  07/27/2015 8:17 AM  Dawn Mcmahon  has presented today for surgery, with the diagnosis of persistant left ovarian cyst, family histoy of breast cancer  The various methods of treatment have been discussed with the patient and family. After consideration of risks, benefits and other options for treatment, the patient has consented to  Procedure(s): LAPAROSCOPIC UNILATERAL SALPINGO OOPHORECTOMY (Left) as a surgical intervention .  The patient's history has been reviewed, patient examined, no change in status, stable for surgery.  I have reviewed the patient's chart and labs.  Questions were answered to the patient's satisfaction.     Ok EdwardsFERNANDEZ,[name]

## 2015-07-27 NOTE — Anesthesia Procedure Notes (Signed)
Procedure Name: Intubation Date/Time: 07/27/2015 8:49 AM Performed by: Yolonda KidaARVER, Lorali Khamis L Pre-anesthesia Checklist: Patient identified, Emergency Drugs available, Suction available and Patient being monitored Patient Re-evaluated:Patient Re-evaluated prior to inductionOxygen Delivery Method: Circle system utilized Preoxygenation: Pre-oxygenation with 100% oxygen Intubation Type: IV induction Ventilation: Mask ventilation without difficulty Laryngoscope Size: Mac and 3 Grade View: Grade I Tube type: Oral Tube size: 7.0 mm Number of attempts: 1 Airway Equipment and Method: Stylet Placement Confirmation: ETT inserted through vocal cords under direct vision,  CO2 detector,  positive ETCO2 and breath sounds checked- equal and bilateral Secured at: 20 cm Tube secured with: Tape Dental Injury: Teeth and Oropharynx as per pre-operative assessment

## 2015-07-27 NOTE — H&P (View-Only) (Signed)
Dawn Mcmahon is an 49 y.o. female for her preoperative examination and consultation prior to her scheduled laparoscopic left salpingo-oophorectomy next week. Her history is as follows:  Patient was seen in the office for her annual gynecological examination was September 30 and had been complaining of abdominal bloating. She had previously been seen by the gastroenterologist would done an endoscopy and colonoscopy. She was informed of the colonoscopy was negative. Patient does have history of benign colon polyps that were removed at the age of 49.Review of her record indicated that several years ago she had a laparoscopic-assisted vaginal hysterectomy with right salpingo-oophorectomy.Her sister had breast cancer at the age of 77 as well as an aunt who also had breast cancer. Patient was tested for the BRCA one BRCA2 gene mutation and was negative. Patient has done well with the Elestrin 0.06% transdermal gel which has helped her vasomotor symptoms. Her ultrasound done at last office visit on 06/16/2015 to return to the following:  Absent uterus and right adnexa. Left ovary there was a thinwall cyst measuring 11 x 6 x 10 mm with a solid focus measuring 4 x 5 mm with no color-flow noted. Arterial blood flow was seen in the left ovary. No fluid in the cul-de-sac. Patient had a normal CA-125 with a value of 3  Pertinent Gynecological History: Menses: Post-hysterectomy Bleeding: Post hysterectomy Contraception: Hysterectomy DES exposure: unknown Blood transfusions: none Sexually transmitted diseases: no past history Previous GYN Procedures: One cesarean section, 1 D&C for elective abortion, 1 laparoscopic tubal ligation, and laparoscopic-assisted vaginal hysterectomy with right salpingo-oophorectomy.  Last mammogram: normal Date: 2016 Last pap: normal Date: 2012 OB History: G 2, P 1 A1  Menstrual History: Menarche age: 4 No LMP recorded. Patient has had a hysterectomy.    Past Medical  History  Diagnosis Date  . BRCA1 negative 06/2010  . BRCA2 negative 06/2010  . Vertigo   . Depression   . Thyrotoxicosis   . Benign colon polyp   . Chronic kidney disease     kidney stone  . Kidney stone   . Headache     Migraines    Past Surgical History  Procedure Laterality Date  . Dilation and curettage of uterus    . Cesarean section    . Abdominal hysterectomy  04/15/2007    [facility]  . Lumbar disc surgery      L4-L5  . Tubal ligation    . Tonsillectomy    . Cystoscopy w/ ureteral stent placement  03/18/2012    Procedure: CYSTOSCOPY WITH RETROGRADE PYELOGRAM/URETERAL STENT PLACEMENT;  Surgeon: Reece Packer, MD;  Location: [facility];  Service: Urology;  Laterality: Right;  . Lithotripsy  AUGUST 2013  . Colonoscopy w/ biopsies  05/28/15  . Upper gi endoscopy      Family History  Problem Relation Age of Onset  . Cancer Mother 79    COLON  . Breast cancer Sister 81  . Cancer Father     LYMPHOMA  . Breast cancer Maternal Aunt   . Cancer Maternal Grandmother     BLADDER    Social History:  reports that she has quit smoking. Her smoking use included E-cigarettes. She has never used smokeless tobacco. She reports that she drinks alcohol. She reports that she does not use illicit drugs.  Allergies:  Allergies  Allergen Reactions  . Omnicef [Cefdinir] Other (See Comments)    disoriented  . Tramadol Nausea And Vomiting     (Not in a hospital admission)  REVIEW OF SYSTEMS: A ROS was performed and pertinent positives and negatives are included in the history.  GENERAL: No fevers or chills. HEENT: No change in vision, no earache, sore throat or sinus congestion. NECK: No pain or stiffness. CARDIOVASCULAR: No chest pain or pressure. No palpitations. PULMONARY: No shortness of breath, cough or wheeze. GASTROINTESTINAL: No abdominal pain, nausea, vomiting or diarrhea, melena or bright red blood per rectum. GENITOURINARY: No urinary frequency, urgency, hesitancy or  dysuria. MUSCULOSKELETAL: No joint or muscle pain, no back pain, no recent trauma. DERMATOLOGIC: No rash, no itching, no lesions. ENDOCRINE: No polyuria, polydipsia, no heat or cold intolerance. No recent change in weight. HEMATOLOGICAL: No anemia or easy bruising or bleeding. NEUROLOGIC: No headache, seizures, numbness, tingling or weakness. PSYCHIATRIC: No depression, no loss of interest in normal activity or change in sleep pattern.     Blood pressure 138/80, height '5\' 2"'  (1.575 m), weight 142 lb (64.411 kg).  Physical Exam:  HEENT:unremarkable Neck:Supple, midline, no thyroid megaly, no carotid bruits Lungs:  Clear to auscultation no rhonchi's or wheezes Heart:Regular rate and rhythm, no murmurs or gallops Breast Exam: Examined earlier this year normal Abdomen: Soft nontender no rebound or guarding subumbilical scar from previous laparoscopy as well as Pfannenstiel scar Pelvic:BUS within normal limits Vagina: Vaginal cuff intact Cervix: Absent Uterus: Absent Adnexa: No large masses although tender in the left lower quadrant Extremities: No cords, no edema Rectal: Not examined  Assessment: Patient with persistent left ovarian cyst complaining of left lower quadrant discomfort will be scheduled to undergo laparoscopic left salpingo-oophorectomy possible laparotomy. The following risk factors discussed:                        Patient was counseled as to the risk of surgery to include the following:  1. Infection (prohylactic antibiotics will be administered)  2. DVT/Pulmonary Embolism (prophylactic pneumo compression stockings will be used)  3.Trauma to internal organs requiring additional surgical procedure to repair any injury to     Internal organs requiring perhaps additional hospitalization days.  4.Hemmorhage requiring transfusion and blood products which carry risks such as             anaphylactic reaction, hepatitis and AIDS  Patient had received literature information on  the procedure scheduled and all her questions were answered and fully accepts all risk.   [facility] HMD4:27 PMTD'@Note' :      Terrance Mass 07/22/2015, 3:47 PM  Note: This dictation was prepared with  Dragon/digital dictation along withSmart phrase technology. Any transcriptional errors that result from this process are unintentional.

## 2015-07-28 ENCOUNTER — Telehealth: Payer: Self-pay

## 2015-07-28 ENCOUNTER — Encounter (HOSPITAL_COMMUNITY): Payer: Self-pay | Admitting: Gynecology

## 2015-07-28 NOTE — Telephone Encounter (Signed)
-----   Message from Ok EdwardsJuan H Fernandez, MD sent at 07/28/2015  2:33 PM EST ----- Please inform patient her pathology report was benign

## 2015-07-28 NOTE — Telephone Encounter (Signed)
Patient had Lap LSO 07/27/15.  She asked if ok to #1 get dental work for a crown next Weds.                                                                                   #2 get her Hepatitis vaccine next week.  I told her I thought both of those would be just fine. Ok?

## 2015-07-28 NOTE — Telephone Encounter (Signed)
Yes no problem

## 2015-08-02 NOTE — Telephone Encounter (Signed)
Left on pt voicemail the below. 

## 2015-08-03 ENCOUNTER — Encounter: Payer: Self-pay | Admitting: Women's Health

## 2015-08-03 ENCOUNTER — Ambulatory Visit (INDEPENDENT_AMBULATORY_CARE_PROVIDER_SITE_OTHER): Payer: BLUE CROSS/BLUE SHIELD | Admitting: Women's Health

## 2015-08-03 VITALS — BP 130/80 | Ht 62.0 in | Wt 141.0 lb

## 2015-08-03 DIAGNOSIS — R21 Rash and other nonspecific skin eruption: Secondary | ICD-10-CM

## 2015-08-03 DIAGNOSIS — Z0289 Encounter for other administrative examinations: Secondary | ICD-10-CM

## 2015-08-03 MED ORDER — BETAMETHASONE DIPROPIONATE 0.05 % EX CREA: TOPICAL_CREAM | Freq: Two times a day (BID) | CUTANEOUS | Status: DC

## 2015-08-03 NOTE — Patient Instructions (Signed)
Diprolen Drug Rash A drug rash is a change in the color or texture of the skin that is caused by a drug. It can develop minutes, hours, or days after the person takes the drug. CAUSES This condition is usually caused by a drug allergy. It can also be caused by exposure to sunlight after taking a drug that makes the skin sensitive to light. Drugs that commonly cause rashes include:  Penicillin.  Antibiotic medicines.  Medicines that treat seizures.  Medicines that treat cancer (chemotherapy).  Aspirin and other nonsteroidal anti-inflammatory drugs (NSAIDs).  Injectable dyes that contain iodine.  Insulin. SYMPTOMS Symptoms of this condition include:  Redness.  Tiny bumps.  Peeling.  Itching.  Itchy welts (hives).  Swelling. The rash may appear on a small area of skin or all over the body. DIAGNOSIS To diagnose the condition, your health care provider will do a physical exam. He or she may also order tests to find out which drug caused the rash. Tests to find the cause of a rash include:  Skin tests.  Blood tests.  Drug challenge. For this test, you stop taking all of the drugs that you do not need to take, and then you start taking them again by adding back one of the drugs at a time. TREATMENT A drug rash may be treated with medicines, including:  Antihistamines. These may be given to relieve itching.  An NSAID. This may be given to reduce swelling and treat pain.  A steroid drug. This may be given to reduce swelling. The rash usually goes away when the person stops taking the drug that caused it. HOME CARE INSTRUCTIONS  Take medicines only as directed by your health care provider.  Let all of your health care providers know about any drug reactions you have had in the past.  If you have hives, take a cool shower or use a cool compress to relieve itchiness. SEEK MEDICAL CARE IF:  You have a fever.  Your rash is not going away.  Your rash gets  worse.  Your rash comes back.  You have wheezing or coughing. SEEK IMMEDIATE MEDICAL CARE IF:  You start to have breathing problems.  You start to have shortness of breath.  You face or throat starts to swell.  You have severe weakness with dizziness or fainting.  You have chest pain.   This information is not intended to replace advice given to you by your health care provider. Make sure you discuss any questions you have with your health care provider.   Document Released: 09/28/2004 Document Revised: 09/11/2014 Document Reviewed: 06/17/2014 Elsevier Interactive Patient Education 2016 [employer] e, benadryl 25 mg every 6 hours, loose clothing and call if no relief

## 2015-08-03 NOTE — Progress Notes (Signed)
Patient ID: Dawn FrancoisKaren E Mcmahon, female   DOB: 1966-03-14, 49 y.o.   MRN: [id number] Presents with complaint of red itchy skin surrounding laparoscopic sites. Denies fever. States pain is manageable. Had LSO benign cyst 07/27/2015 per Dr. Lily PeerFernandez.  Exam: Appears well. Abdomen diffuse nonraised erythematous rash surrounding laparoscopic sites on lower abdomen. Mild tenderness, laparoscopic sites well approximated. Appears to be more allergic rash. Dr. Audie BoxFontaine in removed Dermabond.  Allergy to Dermabond  Plan: Benadryl 25 every 6 hours when necessary, Diprolene 0.05% 3 times a day, loose clothing. Keep scheduled follow-up appointment with Dr. Lily PeerFernandez and call if symptoms do not improve.

## 2015-08-04 ENCOUNTER — Encounter: Payer: Self-pay | Admitting: Gynecology

## 2015-08-04 ENCOUNTER — Other Ambulatory Visit: Payer: Self-pay | Admitting: Women's Health

## 2015-08-04 ENCOUNTER — Telehealth: Payer: Self-pay

## 2015-08-04 DIAGNOSIS — L270 Generalized skin eruption due to drugs and medicaments taken internally: Secondary | ICD-10-CM

## 2015-08-04 MED ORDER — PREDNISONE 5 MG (21) PO TBPK: ORAL_TABLET | ORAL | Status: DC

## 2015-08-04 NOTE — Telephone Encounter (Signed)
Patient saw you yesterday with rash at incision. She said you told her to call if not better today. She said it is not better and she thinks it actually might look a little worse.

## 2015-08-04 NOTE — Telephone Encounter (Signed)
Telephone call, states rash appears more red, itching persisting, reports no relief with Benadryl and Diprolene. Will try prednisone 5 mg Dosepak, instructed to call if no relief. Prednisone Dosepak E scribed.

## 2015-08-05 ENCOUNTER — Encounter: Payer: Self-pay | Admitting: Gynecology

## 2015-08-09 ENCOUNTER — Ambulatory Visit (INDEPENDENT_AMBULATORY_CARE_PROVIDER_SITE_OTHER): Payer: BLUE CROSS/BLUE SHIELD | Admitting: Gynecology

## 2015-08-09 ENCOUNTER — Encounter: Payer: Self-pay | Admitting: Gynecology

## 2015-08-09 VITALS — BP 146/86

## 2015-08-09 DIAGNOSIS — Z09 Encounter for follow-up examination after completed treatment for conditions other than malignant neoplasm: Secondary | ICD-10-CM

## 2015-08-09 NOTE — Progress Notes (Signed)
   Patient presented to the office for her two-week postop visit patient is status post laparoscopic left salpingo-oophorectomy as a result of pelvic pain and persistent left ovarian cyst and family history of breast cancer. Patient had return to the office in my absence on November 29 before her preop appointment because she had developed a rash from the Dermabond glue at the 3 incision ports. She has done better since she was placed on a Sterapred (prednisone 5 mg pack for 6 days). The pictures from her surgery as well as findings from her surgery as well as pathology report were shared with the patient as follows:  FINDINGS: Cul-de-sac adhesions were noted. Left tube slightly adhered to the left pelvic sidewall otherwise normal appearance. Absence of uterus. Absence of right tube and ovary. Smooth liver surface and gallbladder seen. Appendix not visualized.  Diagnosis Ovary and fallopian tube, left - BENIGN OVARY WITH ENDOSALPINGIOSIS AND CYSTIC FOLLICLES. - BENIGN FALLOPIAN TUBE.  Exam: All 3 incision ports completely healed minimal erythema noted abdomen soft nontender no rebound or guarding Pelvic exam Bartholin urethra Skene was within normal limits Vagina: Bimanual exam no palpable masses or tenderness Rectal exam: Not done  Assessment/plan: Patient status post left salpingo-oophorectomy approximately 2 weeks ago developed a rash from Dermabond glue has responded to Sterapred. She may resume full normal activity and return to the office next year for annual exam or when necessary. Patient has done well with the transdermal estradiol gel (Elestrin) which is helped her vasomotor symptoms. Patient declined flu vaccine.

## 2015-08-26 ENCOUNTER — Other Ambulatory Visit: Payer: Self-pay | Admitting: Endocrinology

## 2015-08-26 DIAGNOSIS — E049 Nontoxic goiter, unspecified: Secondary | ICD-10-CM

## 2015-11-10 ENCOUNTER — Ambulatory Visit (INDEPENDENT_AMBULATORY_CARE_PROVIDER_SITE_OTHER): Payer: BLUE CROSS/BLUE SHIELD | Admitting: Podiatry

## 2015-11-10 ENCOUNTER — Encounter: Payer: Self-pay | Admitting: Podiatry

## 2015-11-10 ENCOUNTER — Ambulatory Visit (INDEPENDENT_AMBULATORY_CARE_PROVIDER_SITE_OTHER): Payer: BLUE CROSS/BLUE SHIELD

## 2015-11-10 VITALS — BP 153/92 | HR 77 | Resp 16 | Ht 62.5 in | Wt 145.0 lb

## 2015-11-10 DIAGNOSIS — M79671 Pain in right foot: Secondary | ICD-10-CM | POA: Diagnosis not present

## 2015-11-10 DIAGNOSIS — M722 Plantar fascial fibromatosis: Secondary | ICD-10-CM

## 2015-11-10 MED ORDER — MELOXICAM 15 MG PO TABS
15.0000 mg | ORAL_TABLET | Freq: Every day | ORAL | Status: DC
Start: 2015-11-10 — End: 2017-07-04

## 2015-11-10 MED ORDER — TRIAMCINOLONE ACETONIDE 10 MG/ML IJ SUSP
10.0000 mg | Freq: Once | INTRAMUSCULAR | Status: AC
  Administered 2015-11-10: 10 mg

## 2015-11-10 NOTE — Patient Instructions (Signed)

## 2015-11-10 NOTE — Progress Notes (Signed)
   Subjective:    Patient ID: Dawn Mcmahon, female    DOB: 02-02-1966, 50 y.o.   MRN: [id number]  HPI Patient presents with foot pain in their right foot; heel & arch. Pt stated, "foot hurts all day long"; x3 weeks.   Review of Systems  HENT: Positive for sinus pressure and sore throat.   Musculoskeletal: Positive for myalgias.  Neurological: Positive for dizziness and headaches.  Hematological: Bruises/bleeds easily.  All other systems reviewed and are negative.      Objective:   Physical Exam        Assessment & Plan:

## 2015-11-11 NOTE — Progress Notes (Signed)
Subjective:     Patient ID: Dawn Mcmahon, female   DOB: 05/29/1966, 50 y.o.   MRN: [id number]  HPI patient presents stating I have had a lot of pain in my right heel for the last 3 months. I've tried to reduce activity levels but that's not been effective   Review of Systems  All other systems reviewed and are negative.      Objective:   Physical Exam  Constitutional: She is oriented to person, place, and time.  Cardiovascular: Intact distal pulses.   Musculoskeletal: Normal range of motion.  Neurological: She is oriented to person, place, and time.  Skin: Skin is warm.  Nursing note and vitals reviewed.  neurovascular status intact muscle strength adequate range of motion within normal limits with patient found to have diminishment of arch height and exquisite discomfort plantar heel at the insertional point tendon into the calcaneus right with fluid buildup. Good digital perfusion is noted and patient is well oriented 3     Assessment:     Acute plantar fasciitis right with inflammation and fluid around the medial band was structural changes in the arch    Plan:     H&P and condition reviewed with patient. I went ahead today and injected the plantar fascia 3 mg Kenalog 5 mg Xylocaine and applied fascial brace. Gave instructions on physical therapy anti-inflammatory usage and the consideration long-term for orthotics and reappoint to recheck

## 2015-11-24 ENCOUNTER — Ambulatory Visit (INDEPENDENT_AMBULATORY_CARE_PROVIDER_SITE_OTHER): Payer: BLUE CROSS/BLUE SHIELD | Admitting: Podiatry

## 2015-11-24 ENCOUNTER — Encounter: Payer: Self-pay | Admitting: Podiatry

## 2015-11-24 DIAGNOSIS — M722 Plantar fascial fibromatosis: Secondary | ICD-10-CM | POA: Diagnosis not present

## 2015-11-24 MED ORDER — TRIAMCINOLONE ACETONIDE 10 MG/ML IJ SUSP
10.0000 mg | Freq: Once | INTRAMUSCULAR | Status: AC
  Administered 2015-11-24: 10 mg

## 2015-11-25 NOTE — Progress Notes (Signed)
Subjective:     Patient ID: Dawn FrancoisKaren E Mcmahon, female   DOB: 1965/09/14, 50 y.o.   MRN: [id number]  HPI patient states I'm improving but still sore in the heel with activity   Review of Systems     Objective:   Physical Exam Neurovascular status intact muscle strength adequate with exquisite discomfort plantar aspect right heel at the insertional point of the tendon into the calcaneus upon deep palpation    Assessment:     Continue plantar fasciitis right    Plan:     Reinjected the plantar fascia right 3 mg Kenalog 5 mill grams Xylocaine and advised on physical therapy and reappoint to recheck

## 2015-12-08 ENCOUNTER — Ambulatory Visit: Payer: BLUE CROSS/BLUE SHIELD | Admitting: Gynecology

## 2015-12-08 ENCOUNTER — Other Ambulatory Visit: Payer: BLUE CROSS/BLUE SHIELD

## 2015-12-20 ENCOUNTER — Telehealth: Payer: Self-pay | Admitting: *Deleted

## 2015-12-20 NOTE — Telephone Encounter (Signed)
Pt requested copy of her x-rays.  Done called pt to pick up x-ray copy at front desk.

## 2016-03-30 ENCOUNTER — Other Ambulatory Visit: Payer: Self-pay | Admitting: Orthopedic Surgery

## 2016-03-30 DIAGNOSIS — M545 Low back pain: Secondary | ICD-10-CM

## 2016-04-10 ENCOUNTER — Ambulatory Visit
Admission: RE | Admit: 2016-04-10 | Discharge: 2016-04-10 | Disposition: A | Discharge status: Home / Self Care | Payer: BLUE CROSS/BLUE SHIELD | Source: Ambulatory Visit | Attending: Orthopedic Surgery | Admitting: Orthopedic Surgery

## 2016-04-10 DIAGNOSIS — M545 Low back pain: Secondary | ICD-10-CM

## 2016-06-14 ENCOUNTER — Ambulatory Visit (INDEPENDENT_AMBULATORY_CARE_PROVIDER_SITE_OTHER): Payer: BLUE CROSS/BLUE SHIELD | Admitting: Gynecology

## 2016-06-14 ENCOUNTER — Encounter: Payer: Self-pay | Admitting: Gynecology

## 2016-06-14 VITALS — BP 138/80 | Ht 62.0 in | Wt 147.0 lb

## 2016-06-14 DIAGNOSIS — R232 Flushing: Secondary | ICD-10-CM

## 2016-06-14 DIAGNOSIS — Z113 Encounter for screening for infections with a predominantly sexual mode of transmission: Secondary | ICD-10-CM | POA: Diagnosis not present

## 2016-06-14 DIAGNOSIS — J0120 Acute ethmoidal sinusitis, unspecified: Secondary | ICD-10-CM | POA: Diagnosis not present

## 2016-06-14 DIAGNOSIS — Z79890 Hormone replacement therapy (postmenopausal): Secondary | ICD-10-CM | POA: Diagnosis not present

## 2016-06-14 DIAGNOSIS — Z01411 Encounter for gynecological examination (general) (routine) with abnormal findings: Secondary | ICD-10-CM | POA: Diagnosis not present

## 2016-06-14 LAB — COMPREHENSIVE METABOLIC PANEL
ALBUMIN: 4.2 g/dL (ref 3.6–5.1)
ALK PHOS: 42 U/L (ref 33–130)
ALT: 16 U/L (ref 6–29)
AST: 21 U/L (ref 10–35)
BILIRUBIN TOTAL: 0.4 mg/dL (ref 0.2–1.2)
BUN: 9 mg/dL (ref 7–25)
CALCIUM: 9 mg/dL (ref 8.6–10.4)
CO2: 24 mmol/L (ref 20–31)
CREATININE: 0.57 mg/dL (ref 0.50–1.05)
Chloride: 103 mmol/L (ref 98–110)
Glucose, Bld: 88 mg/dL (ref 65–99)
Potassium: 4.1 mmol/L (ref 3.5–5.3)
SODIUM: 138 mmol/L (ref 135–146)
TOTAL PROTEIN: 6.9 g/dL (ref 6.1–8.1)

## 2016-06-14 LAB — CBC WITH DIFFERENTIAL/PLATELET
BASOS PCT: 0 %
Basophils Absolute: 0 cells/uL (ref 0–200)
EOS PCT: 4 %
Eosinophils Absolute: 224 cells/uL (ref 15–500)
HEMATOCRIT: 39.1 % (ref 35.0–45.0)
HEMOGLOBIN: 13 g/dL (ref 11.7–15.5)
LYMPHS ABS: 1568 {cells}/uL (ref 850–3900)
Lymphocytes Relative: 28 %
MCH: 31.6 pg (ref 27.0–33.0)
MCHC: 33.2 g/dL (ref 32.0–36.0)
MCV: 94.9 fL (ref 80.0–100.0)
MONO ABS: 560 {cells}/uL (ref 200–950)
MPV: 10.4 fL (ref 7.5–12.5)
Monocytes Relative: 10 %
NEUTROS ABS: 3248 {cells}/uL (ref 1500–7800)
NEUTROS PCT: 58 %
Platelets: 218 10*3/uL (ref 140–400)
RBC: 4.12 MIL/uL (ref 3.80–5.10)
RDW: 12.7 % (ref 11.0–15.0)
WBC: 5.6 10*3/uL (ref 3.8–10.8)

## 2016-06-14 LAB — CHOLESTEROL, TOTAL: Cholesterol: 208 mg/dL — ABNORMAL HIGH (ref 125–200)

## 2016-06-14 MED ORDER — MECLIZINE HCL 12.5 MG PO TABS: 12.5000 mg | ORAL_TABLET | Freq: Three times a day (TID) | ORAL | 0 refills | Status: DC | PRN

## 2016-06-14 MED ORDER — BENZONATATE 200 MG PO CAPS: 200.0000 mg | ORAL_CAPSULE | Freq: Three times a day (TID) | ORAL | 0 refills | Status: DC | PRN

## 2016-06-14 MED ORDER — AZITHROMYCIN 250 MG PO TABS: ORAL_TABLET | ORAL | 0 refills | Status: DC

## 2016-06-14 MED ORDER — ESTRADIOL 0.52 MG/0.87 GM (0.06%) TD GEL: 1.0000 "application " | Freq: Every day | TRANSDERMAL | 2 refills | Status: DC

## 2016-06-14 NOTE — Patient Instructions (Signed)
Benzonatate capsules  What is this medicine?  BENZONATATE (ben ZOE na tate) is used to treat cough.  This medicine may be used for other purposes; ask your health care provider or pharmacist if you have questions.  What should I tell my health care provider before I take this medicine?  They need to know if you have any of these conditions:  -kidney or liver disease  -an unusual or allergic reaction to benzonatate, anesthetics, other medicines, foods, dyes, or preservatives  -pregnant or trying to get pregnant  -breast-feeding  How should I use this medicine?  Take this medicine by mouth with a glass of water. Follow the directions on the prescription label. Avoid breaking, chewing, or sucking the capsule, as this can cause serious side effects. Take your medicine at regular intervals. Do not take your medicine more often than directed.  Talk to your pediatrician regarding the use of this medicine in children. While this drug may be prescribed for children as young as 10 years old for selected conditions, precautions do apply.  Overdosage: If you think you have taken too much of this medicine contact a poison control center or emergency room at once.  NOTE: This medicine is only for you. Do not share this medicine with others.  What if I miss a dose?  If you miss a dose, take it as soon as you can. If it is almost time for your next dose, take only that dose. Do not take double or extra doses.  What may interact with this medicine?  Do not take this medicine with any of the following medications:  -MAOIs like Carbex, Eldepryl, Marplan, Nardil, and Parnate  This list may not describe all possible interactions. Give your health care provider a list of all the medicines, herbs, non-prescription drugs, or dietary supplements you use. Also tell them if you smoke, drink alcohol, or use illegal drugs. Some items may interact with your medicine.  What should I watch for while using this medicine?  Tell your doctor if your  symptoms do not improve or if they get worse. If you have a high fever, skin rash, or headache, see your health care professional.  You may get drowsy or dizzy. Do not drive, use machinery, or do anything that needs mental alertness until you know how this medicine affects you. Do not sit or stand up quickly, especially if you are an older patient. This reduces the risk of dizzy or fainting spells.  What side effects may I notice from receiving this medicine?  Side effects that you should report to your doctor or health care professional as soon as possible:  -allergic reactions like skin rash, itching or hives, swelling of the face, lips, or tongue  -breathing problems  -chest pain  -confusion or hallucinations  -irregular heartbeat  -numbness of mouth or throat  -seizures  Side effects that usually do not require medical attention (report to your doctor or health care professional if they continue or are bothersome):  -burning feeling in the eyes  -constipation  -headache  -nasal congestion  -stomach upset  This list may not describe all possible side effects. Call your doctor for medical advice about side effects. You may report side effects to FDA at 1-800-FDA-1088.  Where should I keep my medicine?  Keep out of the reach of children.  Store at room temperature between 15 and 30 degrees C (59 and 86 degrees F). Keep tightly closed. Protect from light and moisture. Throw away any   14:52:56) Azithromycin tablets What is this medicine? AZITHROMYCIN (az ith roe MYE sin) is a macrolide antibiotic. It is used to treat or prevent certain kinds of bacterial infections. It will not work for colds, flu, or other viral infections. This medicine may be used  for other purposes; ask your health care provider or pharmacist if you have questions. What should I tell my health care provider before I take this medicine? They need to know if you have any of these conditions: -kidney disease -liver disease -irregular heartbeat or heart disease -an unusual or allergic reaction to azithromycin, erythromycin, other macrolide antibiotics, foods, dyes, or preservatives -pregnant or trying to get pregnant -breast-feeding How should I use this medicine? Take this medicine by mouth with a full glass of water. Follow the directions on the prescription label. The tablets can be taken with food or on an empty stomach. If the medicine upsets your stomach, take it with food. Take your medicine at regular intervals. Do not take your medicine more often than directed. Take all of your medicine as directed even if you think your are better. Do not skip doses or stop your medicine early. Talk to your pediatrician regarding the use of this medicine in children. Special care may be needed. Overdosage: If you think you have taken too much of this medicine contact a poison control center or emergency room at once. NOTE: This medicine is only for you. Do not share this medicine with others. What if I miss a dose? If you miss a dose, take it as soon as you can. If it is almost time for your next dose, take only that dose. Do not take double or extra doses. What may interact with this medicine? Do not take this medicine with any of the following medications: -lincomycin This medicine may also interact with the following medications: -amiodarone -antacids -birth control pills -cyclosporine -digoxin -magnesium -nelfinavir -phenytoin -warfarin This list may not describe all possible interactions. Give your health care provider a list of all the medicines, herbs, non-prescription drugs, or dietary supplements you use. Also tell them if you smoke, drink alcohol, or use illegal  drugs. Some items may interact with your medicine. What should I watch for while using this medicine? Tell your doctor or health care professional if your symptoms do not improve. Do not treat diarrhea with over the counter products. Contact your doctor if you have diarrhea that lasts more than 2 days or if it is severe and watery. This medicine can make you more sensitive to the sun. Keep out of the sun. If you cannot avoid being in the sun, wear protective clothing and use sunscreen. Do not use sun lamps or tanning beds/booths. What side effects may I notice from receiving this medicine? Side effects that you should report to your doctor or health care professional as soon as possible: -allergic reactions like skin rash, itching or hives, swelling of the face, lips, or tongue -confusion, nightmares or hallucinations -dark urine -difficulty breathing -hearing loss -irregular heartbeat or chest pain -pain or difficulty passing urine -redness, blistering, peeling or loosening of the skin, including inside the mouth -white patches or sores in the mouth -yellowing of the eyes or skin Side effects that usually do not require medical attention (report to your doctor or health care professional if they continue or are bothersome): -diarrhea -dizziness, drowsiness -headache -stomach upset or vomiting -tooth discoloration -vaginal irritation This list may not describe all possible side effects. Call your doctor for medical advice  about side effects. You may report side effects to FDA at 1-800-FDA-1088. Where should I keep my medicine? Keep out of the reach of children. Store at room temperature between 15 and 30 degrees C (59 and 86 degrees F). Throw away any unused medicine after the expiration date. NOTE: This sheet is a summary. It may not cover all possible information. If you have questions about this medicine, talk to your doctor, pharmacist, or health care provider.    2016,  Elsevier/Gold Standard. (2013-03-27 15:38:48) Sinusitis, Adult Sinusitis is redness, soreness, and inflammation of the paranasal sinuses. Paranasal sinuses are air pockets within the bones of your face. They are located beneath your eyes, in the middle of your forehead, and above your eyes. In healthy paranasal sinuses, mucus is able to drain out, and air is able to circulate through them by way of your nose. However, when your paranasal sinuses are inflamed, mucus and air can become trapped. This can allow bacteria and other germs to grow and cause infection. Sinusitis can develop quickly and last only a short time (acute) or continue over a long period (chronic). Sinusitis that lasts for more than 12 weeks is considered chronic. CAUSES Causes of sinusitis include:  Allergies.  Structural abnormalities, such as displacement of the cartilage that separates your nostrils (deviated septum), which can decrease the air flow through your nose and sinuses and affect sinus drainage.  Functional abnormalities, such as when the small hairs (cilia) that line your sinuses and help remove mucus do not work properly or are not present. SIGNS AND SYMPTOMS Symptoms of acute and chronic sinusitis are the same. The primary symptoms are pain and pressure around the affected sinuses. Other symptoms include:  Upper toothache.  Earache.  Headache.  Bad breath.  Decreased sense of smell and taste.  A cough, which worsens when you are lying flat.  Fatigue.  Fever.  Thick drainage from your nose, which often is green and may contain pus (purulent).  Swelling and warmth over the affected sinuses. DIAGNOSIS Your health care provider will perform a physical exam. During your exam, your health care provider may perform any of the following to help determine if you have acute sinusitis or chronic sinusitis:  Look in your nose for signs of abnormal growths in your nostrils (nasal polyps).  Tap over the  affected sinus to check for signs of infection.  View the inside of your sinuses using an imaging device that has a light attached (endoscope). If your health care provider suspects that you have chronic sinusitis, one or more of the following tests may be recommended:  Allergy tests.  Nasal culture. A sample of mucus is taken from your nose, sent to a lab, and screened for bacteria.  Nasal cytology. A sample of mucus is taken from your nose and examined by your health care provider to determine if your sinusitis is related to an allergy. TREATMENT Most cases of acute sinusitis are related to a viral infection and will resolve on their own within 10 days. Sometimes, medicines are prescribed to help relieve symptoms of both acute and chronic sinusitis. These may include pain medicines, decongestants, nasal steroid sprays, or saline sprays. However, for sinusitis related to a bacterial infection, your health care provider will prescribe antibiotic medicines. These are medicines that will help kill the bacteria causing the infection. Rarely, sinusitis is caused by a fungal infection. In these cases, your health care provider will prescribe antifungal medicine. For some cases of chronic sinusitis, surgery is  needed. Generally, these are cases in which sinusitis recurs more than 3 times per year, despite other treatments. HOME CARE INSTRUCTIONS  Drink plenty of water. Water helps thin the mucus so your sinuses can drain more easily.  Use a humidifier.  Inhale steam 3-4 times a day (for example, sit in the bathroom with the shower running).  Apply a warm, moist washcloth to your face 3-4 times a day, or as directed by your health care provider.  Use saline nasal sprays to help moisten and clean your sinuses.  Take medicines only as directed by your health care provider.  If you were prescribed either an antibiotic or antifungal medicine, finish it all even if you start to feel better. SEEK  IMMEDIATE MEDICAL CARE IF:  You have increasing pain or severe headaches.  You have nausea, vomiting, or drowsiness.  You have swelling around your face.  You have vision problems.  You have a stiff neck.  You have difficulty breathing.   This information is not intended to replace advice given to you by your health care provider. Make sure you discuss any questions you have with your health care provider.   Document Released: 08/21/2005 Document Revised: 09/11/2014 Document Reviewed: 09/05/2011 Elsevier Interactive Patient Education [employer]2016 [employer] Inc.

## 2016-06-14 NOTE — Progress Notes (Signed)
Dawn Mcmahon [birth year] [id number]   History:    50 y.o.  for annual gyn exam with several complaints today. She states that her hot flashes have gotten worse. She is currently on transdermal estrogen ( Elestrin) whereby she applies one pump to one arm daily nightly. [place] and [place] she began having some sinus pressure and postnasal drip and also a nonproductive cough.   Patient has had history of benign colon polyps removed at age 75. Review of her record indicated that several years ago she had a laparoscopic-assisted vaginal hysterectomy with right salpingo-oophorectomy An in 2016 had a laparoscopic left salpingo-oophorectomy. Patient would like to have an STD screening again this year.Patient also has been followed by an endocrinologist Dr. 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 She is no longer any thyroid medication scheduled to see endocrinologist at the end of the year. Patient has completed her hepatitis B vaccine series. Patient sister had breast cancer at the age of 11 as well as an aunt who also had breast cancer. Patient was tested for the BRCA one BRCA2 gene mutation and was negative.    Past medical history,surgical history, family history and social history were all reviewed and documented in the EPIC chart.  Gynecologic History No LMP recorded. Patient has had a hysterectomy. Contraception: status post hysterectomy Last Pap: 2012. Results were: normal Last mammogram: 2016. Results were: normal  Obstetric History OB History  Gravida Para Term Preterm AB Living  _0 SAB TAB Ectopic Multiple Live Births  1       1    # Outcome Date GA Lbr Len/2nd Weight Sex Delivery Anes PTL Lv  2 SAB           1 Term     F CS-Unspec  N LIV       ROS: A ROS was performed and pertinent positives and negatives are included in the history.  GENERAL: No fevers or chills. HEENT: Sinus  congestion NECK: No pain or stiffness. CARDIOVASCULAR: No chest pain or pressure. No palpitations. PULMONARY: Nonproductive cough. GASTROINTESTINAL: No abdominal pain, nausea, vomiting or diarrhea, melena or bright red blood per rectum. GENITOURINARY: No urinary frequency, urgency, hesitancy or dysuria. MUSCULOSKELETAL: No joint or muscle pain, no back pain, no recent trauma. DERMATOLOGIC: No rash, no itching, no lesions. ENDOCRINE: No polyuria, polydipsia, no heat or cold intolerance. No recent change in weight. HEMATOLOGICAL: No anemia or easy bruising or bleeding. NEUROLOGIC: No headache, seizures, numbness, tingling or weakness. PSYCHIATRIC: No depression, no loss of interest in normal activity or change in sleep pattern.     Exam: chaperone present  BP 138/80   Ht _1  (1.575 m)   Wt 147 lb (66.7 kg)   BMI 26.89 kg/m   Body mass index is 26.89 kg/m.  General appearance : Well developed well nourished female. No acute distress HEENT: Tender maxillary and frontal sinuses Lungs: Clear to auscultation, no rhonchi or wheezes, or rib retractions  Heart: Regular rate and rhythm, no murmurs or gallops Breast:Examined in sitting and supine position were symmetrical in appearance, no palpable masses or tenderness,  no skin retraction, no nipple inversion, no nipple discharge, no skin discoloration, no axillary or supraclavicular lymphadenopathy Abdomen: no palpable masses or tenderness, no rebound or guarding Extremities: no edema or skin discoloration or tenderness  Pelvic:  Bartholin, Urethra,  Skene Glands: Within normal limits             Vagina: No gross lesions or discharge  Cervix: Absent  Uterus  absent  Adnexa  Without masses or tenderness  Anus and perineum  normal   Rectovaginal  normal sphincter tone without palpated masses or tenderness             Hemoccult colonoscopy 2016     Assessment/Plan:  50 y.o. female for annual exam patient had a colonoscopy in 2016 she is on a 5  year recall. Because of her vasomotor symptoms have recommended she use 2 Pumps of the Elestrin  every other day and on the other days one pump. She can also use peppermint oil to apply behind her ear on a when necessary basis. For her sinusitis she's going to be treated with Zithromax 250 mg twice a day for 5 days. Also for her nonproductive cough and calling in a prescription for Tessalon Perles 200 mg to take 1 by mouth 3 times a day when necessary. Patient also requesting STD screen and for this reason a GC and Chlamydia culture along with an HIV, RPR, hepatitis B and C Will BE obtained today along with a CBC, screening cholesterol, comprehensive metabolic panel, and urinalysis. Her endocrinologist will be doing her thyroid function tests and her thyroid scan within the next few weeks. Patient is schedule her mammogram for this year.   Terrance Mass MD, 5:09 PM 06/14/2016

## 2016-06-15 ENCOUNTER — Ambulatory Visit (INDEPENDENT_AMBULATORY_CARE_PROVIDER_SITE_OTHER): Payer: BLUE CROSS/BLUE SHIELD | Admitting: Orthopedic Surgery

## 2016-06-15 ENCOUNTER — Other Ambulatory Visit: Payer: Self-pay | Admitting: Gynecology

## 2016-06-15 DIAGNOSIS — M79671 Pain in right foot: Secondary | ICD-10-CM | POA: Diagnosis not present

## 2016-06-15 DIAGNOSIS — M7671 Peroneal tendinitis, right leg: Secondary | ICD-10-CM | POA: Diagnosis not present

## 2016-06-15 DIAGNOSIS — E7800 Pure hypercholesterolemia, unspecified: Secondary | ICD-10-CM

## 2016-06-15 LAB — URINALYSIS W MICROSCOPIC + REFLEX CULTURE
Bacteria, UA: NONE SEEN [HPF]
Bilirubin Urine: NEGATIVE
CASTS: NONE SEEN [LPF]
CRYSTALS: NONE SEEN [HPF]
Glucose, UA: NEGATIVE
HGB URINE DIPSTICK: NEGATIVE
KETONES UR: NEGATIVE
Leukocytes, UA: NEGATIVE
NITRITE: NEGATIVE
PH: 7.5 (ref 5.0–8.0)
Protein, ur: NEGATIVE
RBC / HPF: NONE SEEN RBC/HPF (ref <=2)
SPECIFIC GRAVITY, URINE: 1.007 (ref 1.001–1.035)
Squamous Epithelial / LPF: NONE SEEN [HPF] (ref <=5)
WBC, UA: NONE SEEN WBC/HPF (ref <=5)
YEAST: NONE SEEN [HPF]

## 2016-06-15 LAB — HEPATITIS B SURFACE ANTIGEN: HEP B S AG: NEGATIVE

## 2016-06-15 LAB — HEPATITIS C ANTIBODY: HCV AB: NEGATIVE

## 2016-06-15 LAB — RPR

## 2016-06-15 LAB — HIV ANTIBODY (ROUTINE TESTING W REFLEX): HIV 1&2 Ab, 4th Generation: NONREACTIVE

## 2016-06-15 LAB — GC/CHLAMYDIA PROBE AMP
CT PROBE, AMP APTIMA: NOT DETECTED
GC PROBE AMP APTIMA: NOT DETECTED

## 2016-07-13 ENCOUNTER — Ambulatory Visit (INDEPENDENT_AMBULATORY_CARE_PROVIDER_SITE_OTHER): Payer: BLUE CROSS/BLUE SHIELD | Admitting: Orthopedic Surgery

## 2016-08-02 ENCOUNTER — Ambulatory Visit
Admission: RE | Admit: 2016-08-02 | Discharge: 2016-08-02 | Disposition: A | Discharge status: Home / Self Care | Payer: BLUE CROSS/BLUE SHIELD | Source: Ambulatory Visit | Attending: Endocrinology | Admitting: Endocrinology

## 2016-08-02 DIAGNOSIS — E049 Nontoxic goiter, unspecified: Secondary | ICD-10-CM

## 2016-09-04 HISTORY — PX: MOUTH SURGERY: SHX715

## 2016-12-11 ENCOUNTER — Encounter: Payer: Self-pay | Admitting: Gynecology

## 2017-01-17 ENCOUNTER — Encounter: Payer: Self-pay | Admitting: Gynecology

## 2017-07-04 ENCOUNTER — Ambulatory Visit (INDEPENDENT_AMBULATORY_CARE_PROVIDER_SITE_OTHER): Payer: BLUE CROSS/BLUE SHIELD | Admitting: Obstetrics & Gynecology

## 2017-07-04 ENCOUNTER — Encounter: Payer: Self-pay | Admitting: Obstetrics & Gynecology

## 2017-07-04 VITALS — BP 130/86 | Ht 62.0 in | Wt 149.0 lb

## 2017-07-04 DIAGNOSIS — B373 Candidiasis of vulva and vagina: Secondary | ICD-10-CM

## 2017-07-04 DIAGNOSIS — Z79890 Hormone replacement therapy (postmenopausal): Secondary | ICD-10-CM

## 2017-07-04 DIAGNOSIS — Z01411 Encounter for gynecological examination (general) (routine) with abnormal findings: Secondary | ICD-10-CM

## 2017-07-04 DIAGNOSIS — B3731 Acute candidiasis of vulva and vagina: Secondary | ICD-10-CM

## 2017-07-04 DIAGNOSIS — E8940 Asymptomatic postprocedural ovarian failure: Secondary | ICD-10-CM | POA: Diagnosis not present

## 2017-07-04 DIAGNOSIS — Z90710 Acquired absence of both cervix and uterus: Secondary | ICD-10-CM

## 2017-07-04 MED ORDER — MECLIZINE HCL 12.5 MG PO TABS: 12.5000 mg | ORAL_TABLET | Freq: Three times a day (TID) | ORAL | 2 refills | Status: AC | PRN

## 2017-07-04 MED ORDER — ESTRADIOL 0.52 MG/0.87 GM (0.06%) TD GEL: 1.0000 "application " | Freq: Every day | TRANSDERMAL | 4 refills | Status: DC

## 2017-07-04 MED ORDER — FLUCONAZOLE 150 MG PO TABS: 150.0000 mg | ORAL_TABLET | Freq: Once | ORAL | 3 refills | Status: AC

## 2017-07-04 NOTE — Patient Instructions (Signed)
1. Encounter for gynecological examination with abnormal finding Gyn exam S/P Vaginal hysterectomy/BSO.  Pap reflex done.  Breasts wnl.  Mammo normal 2018.  2. Surgical menopause on hormone replacement therapy Well on Elestrin gel.  Represcribed.  Vit D supplements/Ca++ in food/Weight bearing physical activity.  Schedule Bone density.  3. Hx of total hysterectomy   Dawn Mcmahon, it was a pleasure seeing you today!  I will inform you of your results as soon as available.   Health Maintenance for Postmenopausal Women Menopause is a normal process in which your reproductive ability comes to an end. This process happens gradually over a span of months to years, usually between the ages of 73 and 22. Menopause is complete when you have missed 12 consecutive menstrual periods. It is important to talk with your health care provider about some of the most common conditions that affect postmenopausal women, such as heart disease, cancer, and bone loss (osteoporosis). Adopting a healthy lifestyle and getting preventive care can help to promote your health and wellness. Those actions can also lower your chances of developing some of these common conditions. What should I know about menopause? During menopause, you may experience a number of symptoms, such as:  Moderate-to-severe hot flashes.  Night sweats.  Decrease in sex drive.  Mood swings.  Headaches.  Tiredness.  Irritability.  Memory problems.  Insomnia.  Choosing to treat or not to treat menopausal changes is an individual decision that you make with your health care provider. What should I know about hormone replacement therapy and supplements? Hormone therapy products are effective for treating symptoms that are associated with menopause, such as hot flashes and night sweats. Hormone replacement carries certain risks, especially as you become older. If you are thinking about using estrogen or estrogen with progestin treatments, discuss  the benefits and risks with your health care provider. What should I know about heart disease and stroke? Heart disease, heart attack, and stroke become more likely as you age. This may be due, in part, to the hormonal changes that your body experiences during menopause. These can affect how your body processes dietary fats, triglycerides, and cholesterol. Heart attack and stroke are both medical emergencies. There are many things that you can do to help prevent heart disease and stroke:  Have your blood pressure checked at least every 1-2 years. High blood pressure causes heart disease and increases the risk of stroke.  If you are 51-17 years old, ask your health care provider if you should take aspirin to prevent a heart attack or a stroke.  Do not use any tobacco products, including cigarettes, chewing tobacco, or electronic cigarettes. If you need help quitting, ask your health care provider.  It is important to eat a healthy diet and maintain a healthy weight. ? Be sure to include plenty of vegetables, fruits, low-fat dairy products, and lean protein. ? Avoid eating foods that are high in solid fats, added sugars, or salt (sodium).  Get regular exercise. This is one of the most important things that you can do for your health. ? Try to exercise for at least 150 minutes each week. The type of exercise that you do should increase your heart rate and make you sweat. This is known as moderate-intensity exercise. ? Try to do strengthening exercises at least twice each week. Do these in addition to the moderate-intensity exercise.  Know your numbers.Ask your health care provider to check your cholesterol and your blood glucose. Continue to have your blood tested as  directed by your health care provider.  What should I know about cancer screening? There are several types of cancer. Take the following steps to reduce your risk and to catch any cancer development as early as possible. Breast  Cancer  Practice breast self-awareness. ? This means understanding how your breasts normally appear and feel. ? It also means doing regular breast self-exams. Let your health care provider know about any changes, no matter how small.  If you are 71 or older, have a clinician do a breast exam (clinical breast exam or CBE) every year. Depending on your age, family history, and medical history, it may be recommended that you also have a yearly breast X-ray (mammogram).  If you have a family history of breast cancer, talk with your health care provider about genetic screening.  If you are at high risk for breast cancer, talk with your health care provider about having an MRI and a mammogram every year.  Breast cancer (BRCA) gene test is recommended for women who have family members with BRCA-related cancers. Results of the assessment will determine the need for genetic counseling and BRCA1 and for BRCA2 testing. BRCA-related cancers include these types: ? Breast. This occurs in males or females. ? Ovarian. ? Tubal. This may also be called fallopian tube cancer. ? Cancer of the abdominal or pelvic lining (peritoneal cancer). ? Prostate. ? Pancreatic.  Cervical, Uterine, and Ovarian Cancer Your health care provider may recommend that you be screened regularly for cancer of the pelvic organs. These include your ovaries, uterus, and vagina. This screening involves a pelvic exam, which includes checking for microscopic changes to the surface of your cervix (Pap test).  For women ages 21-65, health care providers may recommend a pelvic exam and a Pap test every three years. For women ages 60-65, they may recommend the Pap test and pelvic exam, combined with testing for human papilloma virus (HPV), every five years. Some types of HPV increase your risk of cervical cancer. Testing for HPV may also be done on women of any age who have unclear Pap test results.  Other health care providers may not  recommend any screening for nonpregnant women who are considered low risk for pelvic cancer and have no symptoms. Ask your health care provider if a screening pelvic exam is right for you.  If you have had past treatment for cervical cancer or a condition that could lead to cancer, you need Pap tests and screening for cancer for at least 20 years after your treatment. If Pap tests have been discontinued for you, your risk factors (such as having a new sexual partner) need to be reassessed to determine if you should start having screenings again. Some women have medical problems that increase the chance of getting cervical cancer. In these cases, your health care provider may recommend that you have screening and Pap tests more often.  If you have a family history of uterine cancer or ovarian cancer, talk with your health care provider about genetic screening.  If you have vaginal bleeding after reaching menopause, tell your health care provider.  There are currently no reliable tests available to screen for ovarian cancer.  Lung Cancer Lung cancer screening is recommended for adults 9-59 years old who are at high risk for lung cancer because of a history of smoking. A yearly low-dose CT scan of the lungs is recommended if you:  Currently smoke.  Have a history of at least 30 pack-years of smoking and you currently  smoke or have quit within the past 15 years. A pack-year is smoking an average of one pack of cigarettes per day for one year.  Yearly screening should:  Continue until it has been 15 years since you quit.  Stop if you develop a health problem that would prevent you from having lung cancer treatment.  Colorectal Cancer  This type of cancer can be detected and can often be prevented.  Routine colorectal cancer screening usually begins at age 8 and continues through age 88.  If you have risk factors for colon cancer, your health care provider may recommend that you be screened  at an earlier age.  If you have a family history of colorectal cancer, talk with your health care provider about genetic screening.  Your health care provider may also recommend using home test kits to check for hidden blood in your stool.  A small camera at the end of a tube can be used to examine your colon directly (sigmoidoscopy or colonoscopy). This is done to check for the earliest forms of colorectal cancer.  Direct examination of the colon should be repeated every 5-10 years until age 54. However, if early forms of precancerous polyps or small growths are found or if you have a family history or genetic risk for colorectal cancer, you may need to be screened more often.  Skin Cancer  Check your skin from head to toe regularly.  Monitor any moles. Be sure to tell your health care provider: ? About any new moles or changes in moles, especially if there is a change in a mole's shape or color. ? If you have a mole that is larger than the size of a pencil eraser.  If any of your family members has a history of skin cancer, especially at a young age, talk with your health care provider about genetic screening.  Always use sunscreen. Apply sunscreen liberally and repeatedly throughout the day.  Whenever you are outside, protect yourself by wearing long sleeves, pants, a wide-brimmed hat, and sunglasses.  What should I know about osteoporosis? Osteoporosis is a condition in which bone destruction happens more quickly than new bone creation. After menopause, you may be at an increased risk for osteoporosis. To help prevent osteoporosis or the bone fractures that can happen because of osteoporosis, the following is recommended:  If you are 16-53 years old, get at least 1,000 mg of calcium and at least 600 mg of vitamin D per day.  If you are older than age 74 but younger than age 43, get at least 1,200 mg of calcium and at least 600 mg of vitamin D per day.  If you are older than age 62,  get at least 1,200 mg of calcium and at least 800 mg of vitamin D per day.  Smoking and excessive alcohol intake increase the risk of osteoporosis. Eat foods that are rich in calcium and vitamin D, and do weight-bearing exercises several times each week as directed by your health care provider. What should I know about how menopause affects my mental health? Depression may occur at any age, but it is more common as you become older. Common symptoms of depression include:  Low or sad mood.  Changes in sleep patterns.  Changes in appetite or eating patterns.  Feeling an overall lack of motivation or enjoyment of activities that you previously enjoyed.  Frequent crying spells.  Talk with your health care provider if you think that you are experiencing depression. What should  I know about immunizations? It is important that you get and maintain your immunizations. These include:  Tetanus, diphtheria, and pertussis (Tdap) booster vaccine.  Influenza every year before the flu season begins.  Pneumonia vaccine.  Shingles vaccine.  Your health care provider may also recommend other immunizations. This information is not intended to replace advice given to you by your health care provider. Make sure you discuss any questions you have with your health care provider. Document Released: 10/13/2005 Document Revised: 03/10/2016 Document Reviewed: 05/25/2015 Elsevier Interactive Patient Education  2018 [employer].

## 2017-07-04 NOTE — Progress Notes (Signed)
    Dawn FrancoisKaren E Mcmahon 01-25-1966 [id number]   History:    51 y.o. G2P1A1L1  Married  RP:  Established patient presenting for annual gyn exam   HPI:  S/P Vaginal hysterectomy/BSO.  On Elestrin gel.  Menopausal Sxs well controled on HRT.  No pelvic pain.  Breasts wnl.  Mictions/BMs wnl.  Past medical history,surgical history, family history and social history were all reviewed and documented in the EPIC chart.  Gynecologic History No LMP recorded. Patient has had a hysterectomy. Contraception: status post hysterectomy Last Pap: 2012. Results were: normal Last mammogram: 2018. Results were: normal [facility] 2016 Bone density 2015  Obstetric History OB History  Gravida Para Term Preterm AB Living  2 1 1   1 1   SAB TAB Ectopic Multiple Live Births  1       1    # Outcome Date GA Lbr Len/2nd Weight Sex Delivery Anes PTL Lv  2 SAB           1 Term     F CS-Unspec  N LIV       ROS: A ROS was performed and pertinent positives and negatives are included in the history.  GENERAL: No fevers or chills. HEENT: No change in vision, no earache, sore throat or sinus congestion. NECK: No pain or stiffness. CARDIOVASCULAR: No chest pain or pressure. No palpitations. PULMONARY: No shortness of breath, cough or wheeze. GASTROINTESTINAL: No abdominal pain, nausea, vomiting or diarrhea, melena or bright red blood per rectum. GENITOURINARY: No urinary frequency, urgency, hesitancy or dysuria. MUSCULOSKELETAL: No joint or muscle pain, no back pain, no recent trauma. DERMATOLOGIC: No rash, no itching, no lesions. ENDOCRINE: No polyuria, polydipsia, no heat or cold intolerance. No recent change in weight. HEMATOLOGICAL: No anemia or easy bruising or bleeding. NEUROLOGIC: No headache, seizures, numbness, tingling or weakness. PSYCHIATRIC: No depression, no loss of interest in normal activity or change in sleep pattern.     Exam:   BP 130/86   Ht 5\' 2"  (1.575 m)   Wt 149 lb (67.6 kg)   BMI 27.25 kg/m   Body  mass index is 27.25 kg/m.  General appearance : Well developed well nourished female. No acute distress HEENT: Eyes: no retinal hemorrhage or exudates,  Neck supple, trachea midline, no carotid bruits, no thyroidmegaly Lungs: Clear to auscultation, no rhonchi or wheezes, or rib retractions  Heart: Regular rate and rhythm, no murmurs or gallops Breast:Examined in sitting and supine position were symmetrical in appearance, no palpable masses or tenderness,  no skin retraction, no nipple inversion, no nipple discharge, no skin discoloration, no axillary or supraclavicular lymphadenopathy Abdomen: no palpable masses or tenderness, no rebound or guarding Extremities: no edema or skin discoloration or tenderness  Pelvic: Vulva normal  Bartholin, Urethra, Skene Glands: Within normal limits             Vagina: No gross lesions or discharge.  Pap reflex done.  Cervix/Uterus absent  Adnexa  Without masses or tenderness  Anus and perineum  normal    Assessment/Plan:  51 y.o. female for annual exam   1. Encounter for gynecological examination with abnormal finding Gyn exam S/P Vaginal hysterectomy/BSO.  Pap reflex done.  Breasts wnl.  Mammo normal 2018.  2. Surgical menopause on hormone replacement therapy Well on Elestrin gel.  Represcribed.  Vit D supplements/Ca++ in food/Weight bearing physical activity.  Schedule Bone density.  3. Hx of total hysterectomy   Genia DelMarie-Lyne Vanna Sailer MD, 4:03 PM 07/04/2017

## 2017-07-06 LAB — PAP IG W/ RFLX HPV ASCU

## 2017-07-09 LAB — VITAMIN D 1,25 DIHYDROXY
VITAMIN D3 1, 25 (OH): 42 pg/mL
Vitamin D 1, 25 (OH)2 Total: 42 pg/mL (ref 18–72)

## 2017-07-09 LAB — COMPREHENSIVE METABOLIC PANEL
AG Ratio: 1.5 (calc) (ref 1.0–2.5)
ALKALINE PHOSPHATASE (APISO): 49 U/L (ref 33–130)
ALT: 13 U/L (ref 6–29)
AST: 16 U/L (ref 10–35)
Albumin: 4.1 g/dL (ref 3.6–5.1)
BILIRUBIN TOTAL: 0.5 mg/dL (ref 0.2–1.2)
BUN: 9 mg/dL (ref 7–25)
CALCIUM: 9.1 mg/dL (ref 8.6–10.4)
CO2: 26 mmol/L (ref 20–32)
Chloride: 102 mmol/L (ref 98–110)
Creat: 0.61 mg/dL (ref 0.50–1.05)
Globulin: 2.8 g/dL (calc) (ref 1.9–3.7)
Glucose, Bld: 87 mg/dL (ref 65–99)
POTASSIUM: 3.8 mmol/L (ref 3.5–5.3)
Sodium: 138 mmol/L (ref 135–146)
Total Protein: 6.9 g/dL (ref 6.1–8.1)

## 2017-07-09 LAB — LIPID PANEL
Cholesterol: 216 mg/dL — ABNORMAL HIGH (ref <200)
HDL: 62 mg/dL (ref >50)
LDL Cholesterol (Calc): 129 mg/dL (calc) — ABNORMAL HIGH
Non-HDL Cholesterol (Calc): 154 mg/dL (calc) — ABNORMAL HIGH (ref <130)
TRIGLYCERIDES: 140 mg/dL (ref <150)
Total CHOL/HDL Ratio: 3.5 (calc) (ref <5.0)

## 2017-07-09 LAB — CBC
HEMATOCRIT: 39.7 % (ref 35.0–45.0)
Hemoglobin: 13.3 g/dL (ref 11.7–15.5)
MCH: 31.1 pg (ref 27.0–33.0)
MCHC: 33.5 g/dL (ref 32.0–36.0)
MCV: 92.8 fL (ref 80.0–100.0)
MPV: 10.8 fL (ref 7.5–12.5)
Platelets: 232 10*3/uL (ref 140–400)
RBC: 4.28 10*6/uL (ref 3.80–5.10)
RDW: 11.5 % (ref 11.0–15.0)
WBC: 7.1 10*3/uL (ref 3.8–10.8)

## 2017-07-09 LAB — TSH: TSH: 0.5 m[IU]/L

## 2017-07-10 ENCOUNTER — Other Ambulatory Visit: Payer: Self-pay | Admitting: Gynecology

## 2017-07-10 DIAGNOSIS — Z13820 Encounter for screening for osteoporosis: Secondary | ICD-10-CM

## 2017-07-10 DIAGNOSIS — Z780 Asymptomatic menopausal state: Secondary | ICD-10-CM

## 2017-07-10 DIAGNOSIS — Z9079 Acquired absence of other genital organ(s): Secondary | ICD-10-CM

## 2017-07-10 DIAGNOSIS — Z90722 Acquired absence of ovaries, bilateral: Secondary | ICD-10-CM

## 2017-08-06 ENCOUNTER — Ambulatory Visit (INDEPENDENT_AMBULATORY_CARE_PROVIDER_SITE_OTHER): Payer: BLUE CROSS/BLUE SHIELD

## 2017-08-06 ENCOUNTER — Other Ambulatory Visit: Payer: Self-pay | Admitting: Gynecology

## 2017-08-06 DIAGNOSIS — Z9079 Acquired absence of other genital organ(s): Secondary | ICD-10-CM

## 2017-08-06 DIAGNOSIS — M8589 Other specified disorders of bone density and structure, multiple sites: Secondary | ICD-10-CM | POA: Diagnosis not present

## 2017-08-06 DIAGNOSIS — M8580 Other specified disorders of bone density and structure, unspecified site: Secondary | ICD-10-CM

## 2017-08-06 DIAGNOSIS — Z13820 Encounter for screening for osteoporosis: Secondary | ICD-10-CM

## 2017-08-06 DIAGNOSIS — Z780 Asymptomatic menopausal state: Secondary | ICD-10-CM

## 2017-08-06 DIAGNOSIS — Z90722 Acquired absence of ovaries, bilateral: Secondary | ICD-10-CM

## 2017-08-06 HISTORY — DX: Other specified disorders of bone density and structure, unspecified site: M85.80

## 2017-08-07 ENCOUNTER — Encounter: Payer: Self-pay | Admitting: Gynecology

## 2017-08-16 ENCOUNTER — Other Ambulatory Visit: Payer: Self-pay

## 2017-08-16 MED ORDER — ESTRADIOL 0.52 MG/0.87 GM (0.06%) TD GEL: 1.0000 "application " | Freq: Every day | TRANSDERMAL | 2 refills | Status: AC

## 2017-10-10 ENCOUNTER — Ambulatory Visit: Payer: BLUE CROSS/BLUE SHIELD | Admitting: Obstetrics & Gynecology

## 2017-10-10 ENCOUNTER — Encounter: Payer: Self-pay | Admitting: Obstetrics & Gynecology

## 2017-10-10 VITALS — BP 116/74

## 2017-10-10 DIAGNOSIS — R300 Dysuria: Secondary | ICD-10-CM

## 2017-10-10 DIAGNOSIS — N898 Other specified noninflammatory disorders of vagina: Secondary | ICD-10-CM

## 2017-10-10 LAB — WET PREP FOR TRICH, YEAST, CLUE

## 2017-10-10 MED ORDER — FLUCONAZOLE 150 MG PO TABS: 150.0000 mg | ORAL_TABLET | Freq: Once | ORAL | 0 refills | Status: AC

## 2017-10-10 MED ORDER — METRONIDAZOLE 0.75 % VA GEL: 1.0000 | Freq: Every day | VAGINAL | 0 refills | Status: AC

## 2017-10-10 MED ORDER — PROBIOTIC ACIDOPHILUS PO TABS: 1.0000 | ORAL_TABLET | ORAL | 4 refills | Status: AC

## 2017-10-10 NOTE — Progress Notes (Signed)
    Dawn Mcmahon 01-05-1966 [id number]        52 y.o.  G2P1011  Married  RP: Vaginal and vulvar irritation on off for 2 weeks  HPI: Status post vaginal hysterectomy/BSO.  Menopause well-tolerated without hormone replacement therapy.  Occasionally feels hot at the vulva.  Has vaginal and vulvar irritation on off for 2 weeks without feeling an increase in her vaginal discharge.  Occasional itching, no odor.  Mild burning after micturition.  No urinary frequency.  No pain with intercourse.  No lesions seen on the vulva.  No fever.  No history of STD.   OB History  Gravida Para Term Preterm AB Living  2 1 1   1 1   SAB TAB Ectopic Multiple Live Births  1       1    # Outcome Date GA Lbr Len/2nd Weight Sex Delivery Anes PTL Lv  2 SAB           1 Term     F CS-Unspec  N LIV      Past medical history,surgical history, problem list, medications, allergies, family history and social history were all reviewed and documented in the EPIC chart.   Directed ROS with pertinent positives and negatives documented in the history of present illness/assessment and plan.  Exam:  Vitals:   10/10/17 1617  BP: 116/74   General appearance:  Normal  Abdomen: Normal  Gynecologic exam: Vulva normal.  Speculum exam: Vagina normal.  Increase whitish discharge.  Wet prep done.  U/A: White blood cell negative, red blood cells negative, bacteria negative, no crystals and no casts, no yeast, nitrites negative.  No indication for urine culture. Wet prep: Yeast negative, clue cells negative, trichomonas negative.  Many bacteria and few white blood cells.   Assessment/Plan:  52 y.o. G2P1011   1. Vaginal discharge Increased vaginal discharge with a negative wet prep except for many bacteria.  Given the gynecologic exam, the symptoms and the wet prep results, decision to treat with MetroGel for 5 days and follow with a Diflucan tablet x1.  Will also start probiotic tablets vaginally every week for prevention.   Usage, risks and benefits reviewed. - WET PREP FOR TRICH, YEAST, CLUE  2. Burning with urination Completely negative urine analysis.  Urine culture not indicated.  Patient reassured. - Urinalysis,Complete w/RFL Culture  Other orders - NO CULTURE INDICATED - Lactobacillus (PROBIOTIC ACIDOPHILUS) TABS; Place 1 tablet vaginally once a week. - metroNIDAZOLE (METROGEL) 0.75 % vaginal gel; Place 1 Applicatorful vaginally at bedtime for 5 days. - fluconazole (DIFLUCAN) 150 MG tablet; Take 1 tablet (150 mg total) by mouth once for 1 dose.  Counseling on above issues more than 50% for 15 minutes.  Genia DelMarie-Lyne Leigh Kaeding MD, 4:50 PM 10/10/2017

## 2017-10-10 NOTE — Patient Instructions (Signed)
1. Vaginal discharge Increased vaginal discharge with a negative wet prep except for many bacteria.  Given the gynecologic exam, the symptoms and the wet prep results, decision to treat with MetroGel for 5 days and follow with a Diflucan tablet x1.  Will also start probiotic tablets vaginally every week for prevention.  Usage, risks and benefits reviewed. - WET PREP FOR TRICH, YEAST, CLUE  2. Burning with urination Completely negative urine analysis.  Urine culture not indicated.  Patient reassured. - Urinalysis,Complete w/RFL Culture  Other orders - NO CULTURE INDICATED - Lactobacillus (PROBIOTIC ACIDOPHILUS) TABS; Place 1 tablet vaginally once a week. - metroNIDAZOLE (METROGEL) 0.75 % vaginal gel; Place 1 Applicatorful vaginally at bedtime for 5 days. - fluconazole (DIFLUCAN) 150 MG tablet; Take 1 tablet (150 mg total) by mouth once for 1 dose.  Clydie BraunKaren, good seeing you today!

## 2017-10-11 LAB — URINALYSIS, COMPLETE W/RFL CULTURE
BACTERIA UA: NONE SEEN /HPF
BILIRUBIN URINE: NEGATIVE
GLUCOSE, UA: NEGATIVE
Hgb urine dipstick: NEGATIVE
Hyaline Cast: NONE SEEN /LPF
Ketones, ur: NEGATIVE
LEUKOCYTE ESTERASE: NEGATIVE
NITRITES URINE, INITIAL: NEGATIVE
PROTEIN: NEGATIVE
RBC / HPF: NONE SEEN /HPF (ref 0–2)
Specific Gravity, Urine: 1.02 (ref 1.001–1.03)
WBC, UA: NONE SEEN /HPF (ref 0–5)
pH: 7 (ref 5.0–8.0)

## 2017-10-11 LAB — NO CULTURE INDICATED

## 2018-05-09 ENCOUNTER — Other Ambulatory Visit: Payer: Self-pay | Admitting: Gastroenterology

## 2018-05-09 DIAGNOSIS — R1031 Right lower quadrant pain: Secondary | ICD-10-CM

## 2018-05-14 ENCOUNTER — Encounter (HOSPITAL_COMMUNITY): Payer: Self-pay

## 2018-05-14 ENCOUNTER — Ambulatory Visit (HOSPITAL_COMMUNITY)
Admission: RE | Admit: 2018-05-14 | Discharge: 2018-05-14 | Disposition: A | Discharge status: Home / Self Care | Payer: BLUE CROSS/BLUE SHIELD | Source: Ambulatory Visit | Attending: Gastroenterology | Admitting: Gastroenterology

## 2018-05-14 DIAGNOSIS — R1031 Right lower quadrant pain: Secondary | ICD-10-CM | POA: Insufficient documentation

## 2018-05-14 DIAGNOSIS — I700 Atherosclerosis of aorta: Secondary | ICD-10-CM | POA: Diagnosis not present

## 2018-05-14 MED ORDER — IOHEXOL 300 MG/ML  SOLN
100.0000 mL | Freq: Once | INTRAMUSCULAR | Status: AC | PRN
  Administered 2018-05-14: 100 mL via INTRAVENOUS

## 2018-05-14 MED ORDER — IOPAMIDOL (ISOVUE-300) INJECTION 61%
30.0000 mL | Freq: Once | INTRAVENOUS | Status: DC | PRN
  Administered 2018-05-14: 30 mL via ORAL
  Filled 2018-05-14: qty 30

## 2019-05-28 ENCOUNTER — Encounter: Payer: Self-pay | Admitting: Gynecology

## 2020-05-15 IMAGING — CT CT ABD-PELV W/ CM
2 of 5 series · 16 of 46 positions shown, 18 images · IV contrast (OMNIPAQUE)
Comparison: CT the abdomen and pelvis 03/17/2012.

CLINICAL DATA: 51-year-old female with history of right lower
quadrant abdominal pain over the past 3 months, recently worsening
with diarrhea. History of kidney stones 5 years ago treated with
lithotripsy.

EXAM:
CT ABDOMEN AND PELVIS WITH CONTRAST
TECHNIQUE: Multidetector CT imaging of the abdomen and pelvis was performed
using the standard protocol following bolus administration of
intravenous contrast.
CONTRAST:  100mL OMNIPAQUE IOHEXOL 300 MG/ML SOLN, 30mL [id number]-VUU
IOPAMIDOL (DDOIX6-VUU) INJECTION 61%

[Series 2: axial st · axial · 0.74mm/px · z∈[+1141,+1541]mm · 13 of 92 slices shown, 15 images]
[im 6/92  soft-tissue]
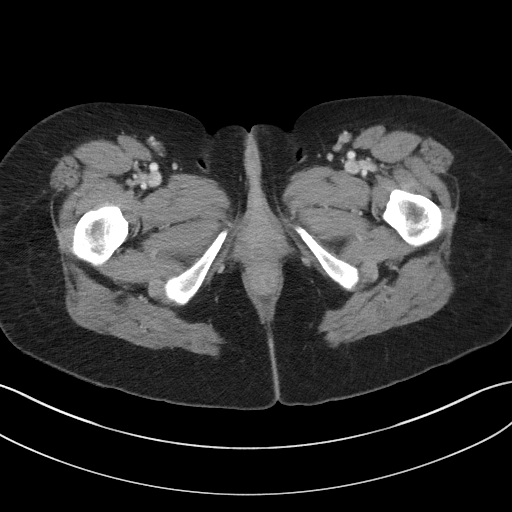
[im 6/92  bone]
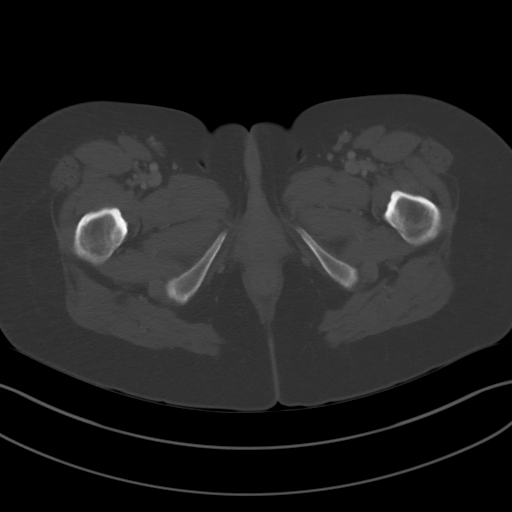
[im 11/92  soft-tissue]
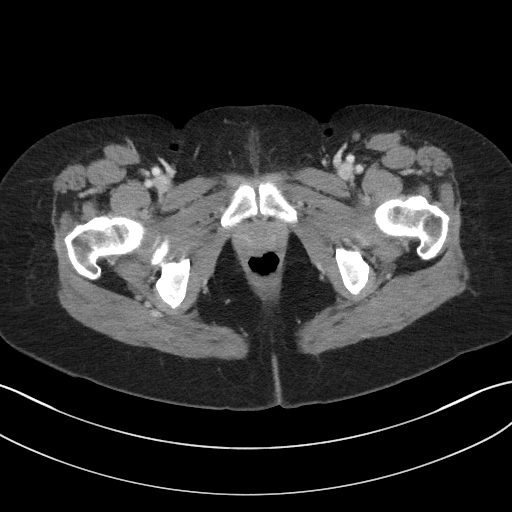
[im 22/92  soft-tissue]
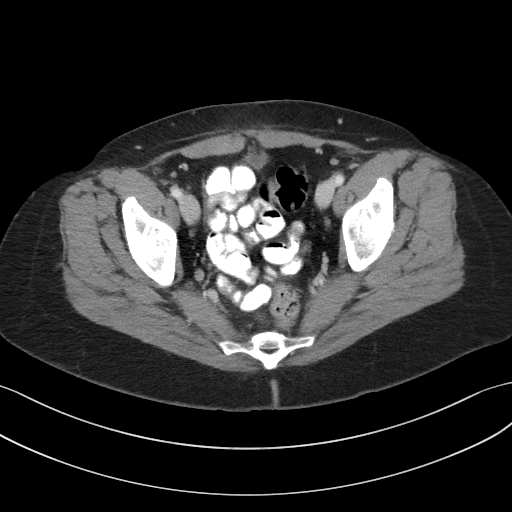
[im 27/92  soft-tissue]
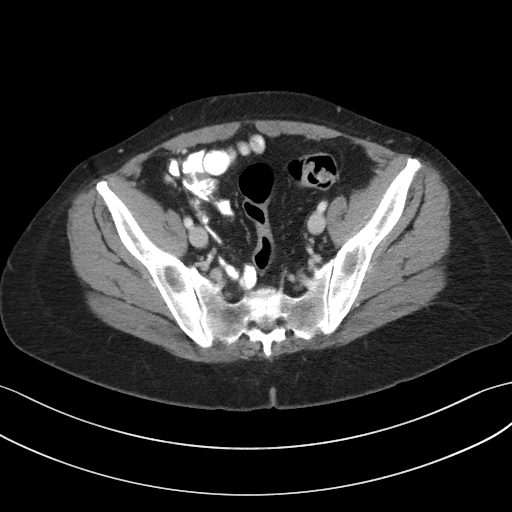
[im 33/92  soft-tissue]
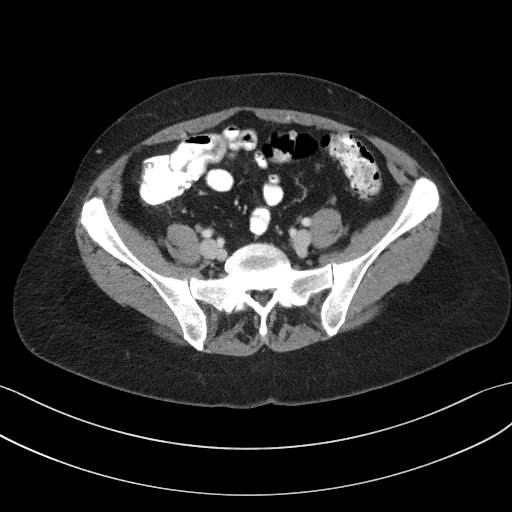
[im 38/92  soft-tissue]
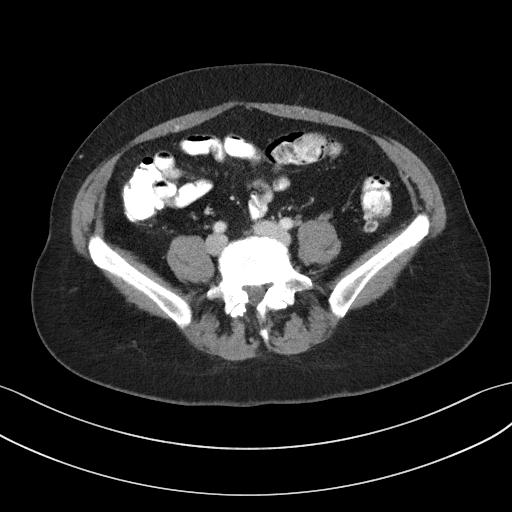
[im 49/92  soft-tissue]
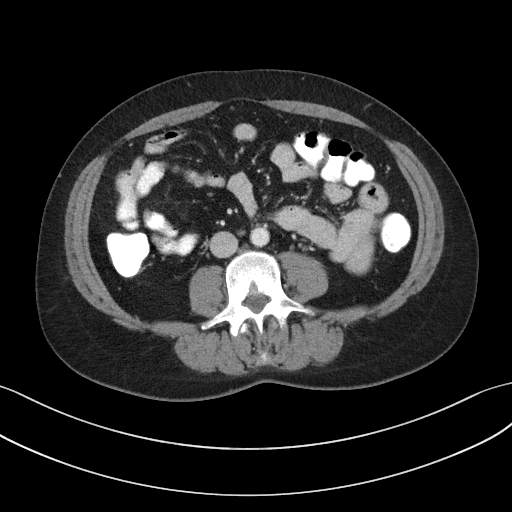
[im 54/92  soft-tissue]
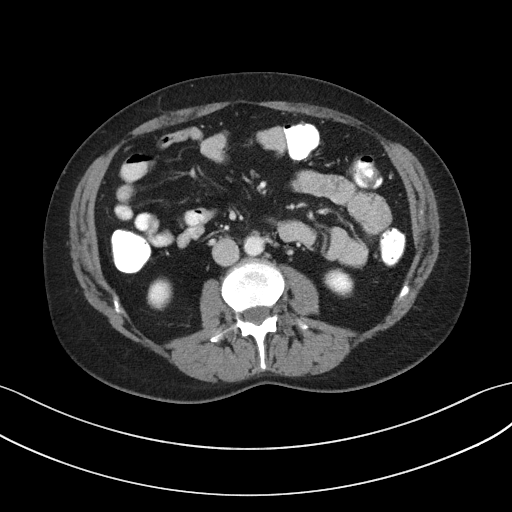
[im 59/92  soft-tissue]
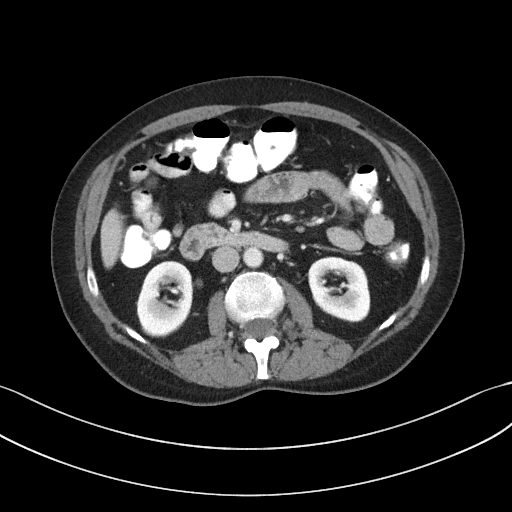
[im 59/92  bone]
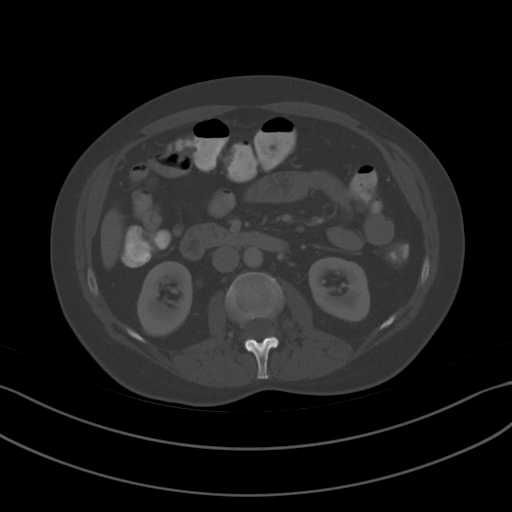
[im 65/92  soft-tissue]
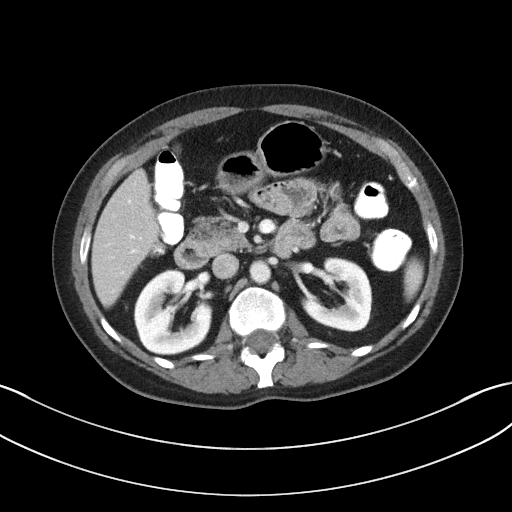
[im 70/92  soft-tissue]
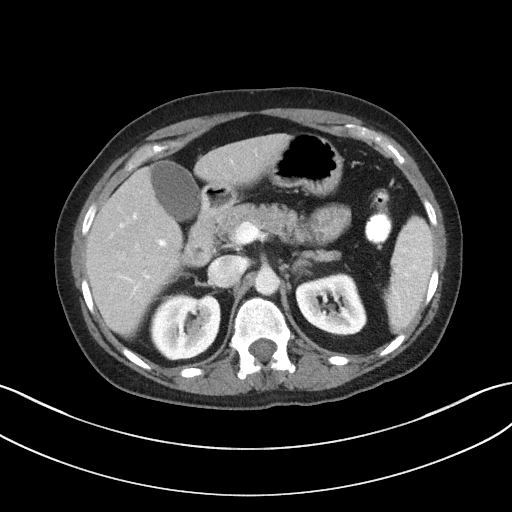
[im 81/92  soft-tissue]
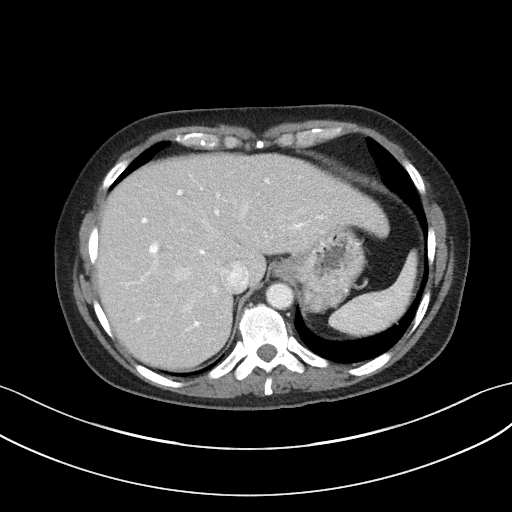
[im 86/92  soft-tissue]
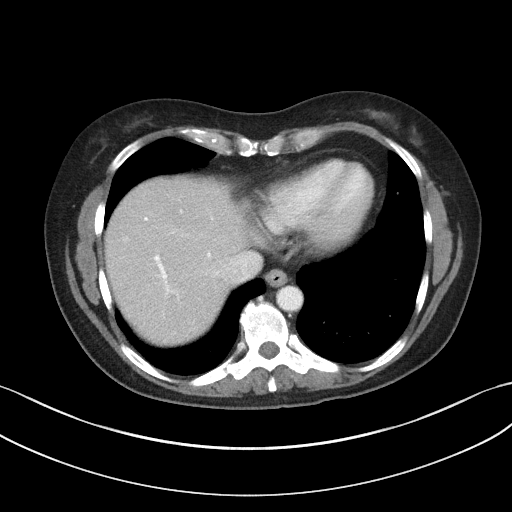

[Series 5: coronal st · coronal · 0.72mm/px · 3 of 101 slices shown]
[im 34/101  soft-tissue]
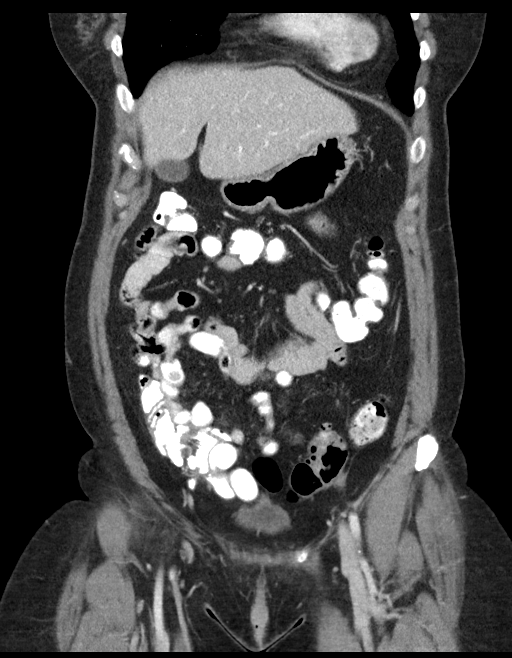
[im 45/101  soft-tissue]
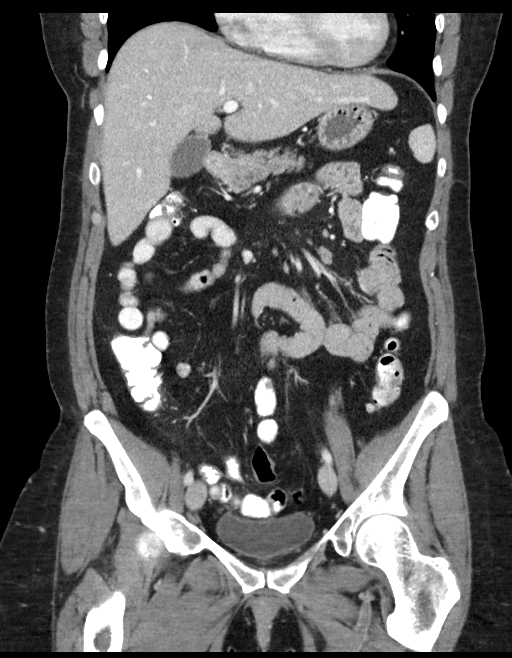
[im 56/101  soft-tissue]
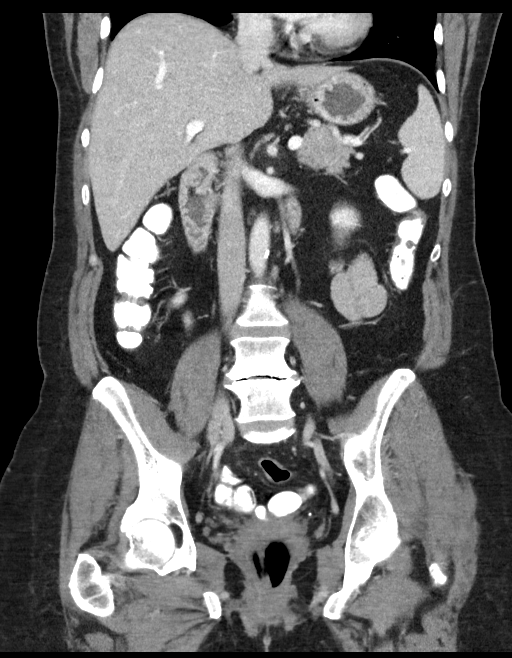

[16 of 46 positions shown; findings below may reference images not displayed]

FINDINGS: Lower chest: Unremarkable.

Hepatobiliary: No suspicious cystic or solid hepatic lesions. No
intra or extrahepatic biliary ductal dilatation. Gallbladder is
normal in appearance.

Pancreas: No pancreatic mass. No pancreatic ductal dilatation. No
pancreatic or peripancreatic fluid or inflammatory changes.

Spleen: Unremarkable.

Adrenals/Urinary Tract: Bilateral kidneys and bilateral adrenal
glands are normal in appearance. No hydroureteronephrosis. Urinary
bladder is normal in appearance.

Stomach/Bowel: Normal appearance of the stomach. No pathologic
dilatation of small bowel or colon. No focal mural thickening or
surrounding inflammatory changes are noted associated with the small
bowel or colon. Terminal ileum is unremarkable in appearance.
Colonic diverticulae are noted in the sigmoid colon and descending
colon, without surrounding inflammatory changes to suggest an acute
diverticulitis at this time. Normal appendix.

Vascular/Lymphatic: Aortic atherosclerosis, without evidence of
aneurysm or dissection in the abdominal or pelvic vasculature. No
lymphadenopathy noted in the abdomen or pelvis.

Reproductive: Status post hysterectomy. Ovaries are not confidently
identified may be surgically absent or atrophic.

Other: No significant volume of ascites.  No pneumoperitoneum.

Musculoskeletal: There are no aggressive appearing lytic or blastic
lesions noted in the visualized portions of the skeleton.
IMPRESSION: 1. No acute findings are noted in the abdomen or pelvis to account
for the patient's symptoms.
2. Normal appendix.
3. Aortic atherosclerosis.
